# Patient Record
Sex: Male | Born: 1965
Health system: Southern US, Community
[De-identification: ages and names within clinical notes are randomized; demographics above are authoritative.]

## PROBLEM LIST (undated history)

## (undated) DIAGNOSIS — F988 Other specified behavioral and emotional disorders with onset usually occurring in childhood and adolescence: Secondary | ICD-10-CM

## (undated) DIAGNOSIS — E782 Mixed hyperlipidemia: Secondary | ICD-10-CM

## (undated) HISTORY — DX: Other specified behavioral and emotional disorders with onset usually occurring in childhood and adolescence: F98.8

## (undated) HISTORY — DX: Male erectile dysfunction, unspecified: N52.9

## (undated) HISTORY — DX: Mixed hyperlipidemia: E78.2

---

## 2018-03-29 ENCOUNTER — Ambulatory Visit: Payer: Self-pay | Admitting: Family Medicine

## 2018-03-29 ENCOUNTER — Ambulatory Visit (INDEPENDENT_AMBULATORY_CARE_PROVIDER_SITE_OTHER): Payer: No Typology Code available for payment source | Admitting: Family Medicine

## 2018-03-29 ENCOUNTER — Encounter: Payer: Self-pay | Admitting: Family Medicine

## 2018-03-29 VITALS — BP 138/89 | HR 73 | Resp 17 | Ht 71.0 in | Wt 238.4 lb

## 2018-03-29 DIAGNOSIS — F900 Attention-deficit hyperactivity disorder, predominantly inattentive type: Secondary | ICD-10-CM

## 2018-03-29 DIAGNOSIS — Z1329 Encounter for screening for other suspected endocrine disorder: Secondary | ICD-10-CM

## 2018-03-29 DIAGNOSIS — Z131 Encounter for screening for diabetes mellitus: Secondary | ICD-10-CM

## 2018-03-29 DIAGNOSIS — Z7689 Persons encountering health services in other specified circumstances: Secondary | ICD-10-CM

## 2018-03-29 DIAGNOSIS — R9431 Abnormal electrocardiogram [ECG] [EKG]: Secondary | ICD-10-CM | POA: Diagnosis not present

## 2018-03-29 DIAGNOSIS — Z9862 Peripheral vascular angioplasty status: Secondary | ICD-10-CM

## 2018-03-29 DIAGNOSIS — Z0000 Encounter for general adult medical examination without abnormal findings: Secondary | ICD-10-CM

## 2018-03-29 NOTE — Patient Instructions (Addendum)
Thank you for choosing Primary Care at [facility] to be your medical home!    Randall Collins was seen by Molli Barrows, FNP today.   Randall Collins's primary care provider is Scot Jun, FNP.   For the best care possible, you should try to see Molli Barrows, FNP-C whenever you come to the clinic.   We look forward to seeing you again soon!  If you have any questions about your visit today, please call us at [phone] or feel free to reach your primary care provider via [place].       I have referred you to [place] [phone].  You have been referred to Cardiology [place] [phone]   Preventive Care 40-64 Years, Male Preventive care refers to lifestyle choices and visits with your health care provider that can promote health and wellness. What does preventive care include?  A yearly physical exam. This is also called an annual well check.  Dental exams once or twice a year.  Routine eye exams. Ask your health care provider how often you should have your eyes checked.  Personal lifestyle choices, including: ? Daily care of your teeth and gums. ? Regular physical activity. ? Eating a healthy diet. ? Avoiding tobacco and drug use. ? Limiting alcohol use. ? Practicing safe sex. ? Taking low-dose aspirin every day starting at age 64. What happens during an annual well check? The services and screenings done by your health care provider during your annual well check will depend on your age, overall health, lifestyle risk factors, and family history of disease. Counseling Your health care provider may ask you questions about your:  Alcohol use.  Tobacco use.  Drug use.  Emotional well-being.  Home and relationship well-being.  Sexual activity.  Eating habits.  Work and work Statistician.  Screening You may have the following tests or measurements:  Height, weight, and BMI.  Blood  pressure.  Lipid and cholesterol levels. These may be checked every 5 years, or more frequently if you are over 42 years old.  Skin check.  Lung cancer screening. You may have this screening every year starting at age 59 if you have a 30-pack-year history of smoking and currently smoke or have quit within the past 15 years.  Fecal occult blood test (FOBT) of the stool. You may have this test every year starting at age 76.  Flexible sigmoidoscopy or colonoscopy. You may have a sigmoidoscopy every 5 years or a colonoscopy every 10 years starting at age 63.  Prostate cancer screening. Recommendations will vary depending on your family history and other risks.  Hepatitis C blood test.  Hepatitis B blood test.  Sexually transmitted disease (STD) testing.  Diabetes screening. This is done by checking your blood sugar (glucose) after you have not eaten for a while (fasting). You may have this done every 1-3 years.  Discuss your test results, treatment options, and if necessary, the need for more tests with your health care provider. Vaccines Your health care provider may recommend certain vaccines, such as:  Influenza vaccine. This is recommended every year.  Tetanus, diphtheria, and acellular pertussis (Tdap, Td) vaccine. You may need a Td booster every 10 years.  Varicella vaccine. You may need this if you have not been vaccinated.  Zoster vaccine. You may need this after age 66.  Measles, mumps, and rubella (MMR) vaccine. You may need at least one dose of MMR if you were born in 1957 or later. You  may also need a second dose.  Pneumococcal 13-valent conjugate (PCV13) vaccine. You may need this if you have certain conditions and have not been vaccinated.  Pneumococcal polysaccharide (PPSV23) vaccine. You may need one or two doses if you smoke cigarettes or if you have certain conditions.  Meningococcal vaccine. You may need this if you have certain conditions.  Hepatitis A  vaccine. You may need this if you have certain conditions or if you travel or work in places where you may be exposed to hepatitis A.  Hepatitis B vaccine. You may need this if you have certain conditions or if you travel or work in places where you may be exposed to hepatitis B.  Haemophilus influenzae type b (Hib) vaccine. You may need this if you have certain risk factors.  Talk to your health care provider about which screenings and vaccines you need and how often you need them. This information is not intended to replace advice given to you by your health care provider. Make sure you discuss any questions you have with your health care provider. Document Released: 05/08/2015 Document Revised: 12/30/2015 Document Reviewed: 02/10/2015 Elsevier Interactive Patient Education  [employer].

## 2018-03-29 NOTE — Progress Notes (Signed)
Randall Collins, is a 52 y.o. male  WUJ:[id number]  [id number]  DOB - 04-10-1966  CC:  Chief Complaint  Patient presents with  . Establish Care  . Annual Exam    not fasting       HPI: Randall Collins is a 52 y.o. male is here today to establish care.   [employer] does not have a problem list on file.   Today's visit:  Patient is here to establish care and for complete CPE.  Patient recently relocated from [place] and is currently an employee at [employer].  Reports that he is generally healthy.  Report at some point he was recommended to start statin therapy due to elevations in LDL.  He opted at that time not to start medication.  He however has a significant cardiovascular family history with his father dying of an MI and diabetes.  His paternal grandfather also died of a heart attack.  His brother has had a heart attack at age 30.  He also has undergone a heart cath back in 2016 for a complaint of chest pain and was followed by cardiology in [place].  He reports that his heart cath was negative and recommendations were made to follow-up periodically with cardiology for monitoring.  He denies any chest pain however notes some shortness of breath with exertion such as going up stairs.  He has not had a recent stress test.  He is mostly an inactive a physical activity.  He is a never smoker.  Occasionally drinks alcohol.  Notes that he does not follow any restricted diet.  Denies edema, new weakness, or chronic headache.  He is requesting referral to cardiology.  He reports a history of ADHD.  Reports he was previously taking Adderall for management of ADHD.  He is currently not under psychiatry care.  He is currently not on any medication.  He is interested in resuming medication for ADHD as he feels that he is having difficulty focusing.  Pertinent family medical history: family history includes Diabetes in his father; Heart attack in his father and paternal grandfather; Hypertension  in his father.   Allergies  Allergen Reactions  . Keflex [Cephalexin] Rash    Social History   Socioeconomic History  . Marital status: Married    Spouse name: Not on file  . Number of children: Not on file  . Years of education: Not on file  . Highest education level: Not on file  Occupational History  . Not on file  Social Needs  . Financial resource strain: Not on file  . Food insecurity:    Worry: Not on file    Inability: Not on file  . Transportation needs:    Medical: Not on file    Non-medical: Not on file  Tobacco Use  . Smoking status: Never Smoker  . Smokeless tobacco: Never Used  Substance and Sexual Activity  . Alcohol use: Yes    Comment: socially  . Drug use: Not on file  . Sexual activity: Yes    Partners: Female  Lifestyle  . Physical activity:    Days per week: Not on file    Minutes per session: Not on file  . Stress: Not on file  Relationships  . Social connections:    Talks on phone: Not on file    Gets together: Not on file    Attends religious service: Not on file    Active member of club or organization: Not on file  Attends meetings of clubs or organizations: Not on file    Relationship status: Not on file  . Intimate partner violence:    Fear of current or ex partner: Not on file    Emotionally abused: Not on file    Physically abused: Not on file    Forced sexual activity: Not on file  Other Topics Concern  . Not on file  Social History Narrative  . Not on file    Review of Systems: Constitutional: Negative for fever, chills, diaphoresis, activity change, appetite change and fatigue. HENT: Negative for ear pain, nosebleeds, congestion, facial swelling, rhinorrhea, neck pain, neck stiffness and ear discharge.  Eyes: Negative for pain, discharge, redness, itching and visual disturbance. Respiratory: Negative for cough, choking, chest tightness, wheezing and stridor.  Shortness of breath with exertion. Cardiovascular: Negative  for chest pain, palpitations and leg swelling. Gastrointestinal: Negative for abdominal distention. Genitourinary: Negative for dysuria, urgency, frequency, hematuria, flank pain, decreased urine volume, difficulty urinating. Musculoskeletal: Negative for back pain, joint swelling, arthralgia and gait problem. Neurological: Negative for dizziness, tremors, seizures, syncope, facial asymmetry, speech difficulty, weakness, light-headedness, numbness and headaches.  Hematological: Negative for adenopathy. Does not bruise/bleed easily. Psychiatric/Behavioral: Negative for hallucinations, behavioral problems, confusion, dysphoric mood, decreased concentration and agitation.  Objective:   Vitals:   03/29/18 1118  BP: 138/89  Pulse: 73  Resp: 17  SpO2: 96%    BP Readings from Last 3 Encounters:  03/29/18 138/89   Filed Weights   03/29/18 1118  Weight: 238 lb 6.4 oz (108.1 kg)      Physical Exam: Constitutional: Patient appears well-developed and well-nourished. No distress. HENT: Normocephalic, atraumatic, External right and left ear normal.  Eyes: Conjunctivae and EOM are normal. PERRLA, no scleral icterus. Neck: Normal ROM. Neck supple. No JVD. No tracheal deviation. No thyromegaly. CVS: RRR, S1/S2 +, no murmurs, no gallops, no carotid bruit.  Pulmonary: Effort and breath sounds normal, no stridor, rhonchi, wheezes, rales.  Abdominal: Soft. BS +, no distension, tenderness, rebound or guarding.  Musculoskeletal: Normal range of motion. No edema and no tenderness.  Neuro: Alert. Normal muscle tone coordination. Normal gait. BUE and BLE strength 5/5. Bilateral hand grips symmetrical. Skin: Skin is warm and dry. No rash noted. Not diaphoretic. No erythema. No pallor. Psychiatric: Normal mood and affect. Behavior, judgment, thought content normal.     Assessment and plan:  1. Encounter to establish care 2. Annual physical exam Age-appropriate anticipatory guidance provided. - EKG  12-Lead - CBC with Differential  3. Screening for diabetes mellitus - Comprehensive metabolic panel - Hemoglobin A1c  4. Screening for thyroid disorder - Thyroid Panel With TSH  5. ECG abnormality EKG today is significant for right bundle branch block and nonspecific T wave-abnormality - Ambulatory referral to Cardiology  6. Hx of angioplasty - Ambulatory referral to Cardiology  7. ADHD (attention deficit hyperactivity disorder), inattentive type Referral placed to the [place] [facility] for further evaluation and work-up and management of ADHD disorder.   Orders Placed This Encounter  Procedures  . CBC with Differential  . Comprehensive metabolic panel  . Thyroid Panel With TSH  . Hemoglobin A1c  . Ambulatory referral to Cardiology    Referral Priority:   Routine    Referral Type:   Consultation    Referral Reason:   Specialty Services Required    Requested Specialty:   Cardiology    Number of Visits Requested:   1  . EKG 12-Lead    Schedule a follow-up  lab appointment to have fasting lipid panel drawn. Follow-up in 1 year for complete physical exam.  Follow-up sooner as needed or if any acute conditions occur.  The patient was given clear instructions to go to ER or return to medical center if symptoms don't improve, worsen or new problems develop. The patient verbalized understanding. The patient was advised  to call and obtain lab results if they haven't heard anything from out office within 7-10 business days.  Joaquin Courts, FNP Primary Care at [facility] [address] [place] [address] [phone]: [phone]    This note has been created with Dragon speech recognition software and Paediatric nurse. Any transcriptional errors are unintentional.

## 2018-03-30 LAB — CBC WITH DIFFERENTIAL/PLATELET
Basophils Absolute: 0 10*3/uL (ref 0.0–0.2)
Basos: 1 %
EOS (ABSOLUTE): 0.1 10*3/uL (ref 0.0–0.4)
EOS: 2 %
Hematocrit: 47.9 % (ref 37.5–51.0)
Hemoglobin: 16 g/dL (ref 13.0–17.7)
IMMATURE GRANS (ABS): 0 10*3/uL (ref 0.0–0.1)
Immature Granulocytes: 0 %
Lymphocytes Absolute: 2 10*3/uL (ref 0.7–3.1)
Lymphs: 34 %
MCH: 29.5 pg (ref 26.6–33.0)
MCHC: 33.4 g/dL (ref 31.5–35.7)
MCV: 88 fL (ref 79–97)
Monocytes Absolute: 0.4 10*3/uL (ref 0.1–0.9)
Monocytes: 7 %
Neutrophils Absolute: 3.3 10*3/uL (ref 1.4–7.0)
Neutrophils: 56 %
Platelets: 281 10*3/uL (ref 150–450)
RBC: 5.42 x10E6/uL (ref 4.14–5.80)
RDW: 13.2 % (ref 12.3–15.4)
WBC: 5.8 10*3/uL (ref 3.4–10.8)

## 2018-03-30 LAB — THYROID PANEL WITH TSH
Free Thyroxine Index: 2.3 (ref 1.2–4.9)
T3 Uptake Ratio: 22 % — ABNORMAL LOW (ref 24–39)
T4, Total: 10.3 ug/dL (ref 4.5–12.0)
TSH: 1.6 u[IU]/mL (ref 0.450–4.500)

## 2018-03-30 LAB — HEMOGLOBIN A1C
Est. average glucose Bld gHb Est-mCnc: 103 mg/dL
Hgb A1c MFr Bld: 5.2 % (ref 4.8–5.6)

## 2018-03-30 LAB — COMPREHENSIVE METABOLIC PANEL
ALBUMIN: 4.7 g/dL (ref 3.5–5.5)
ALT: 31 IU/L (ref 0–44)
AST: 24 IU/L (ref 0–40)
Albumin/Globulin Ratio: 2.2 (ref 1.2–2.2)
Alkaline Phosphatase: 95 IU/L (ref 39–117)
BUN / CREAT RATIO: 15 (ref 9–20)
BUN: 14 mg/dL (ref 6–24)
Bilirubin Total: 0.5 mg/dL (ref 0.0–1.2)
CO2: 25 mmol/L (ref 20–29)
Calcium: 9.6 mg/dL (ref 8.7–10.2)
Chloride: 103 mmol/L (ref 96–106)
Creatinine, Ser: 0.94 mg/dL (ref 0.76–1.27)
GFR calc Af Amer: 107 mL/min/{1.73_m2} (ref >59)
GFR calc non Af Amer: 93 mL/min/{1.73_m2} (ref >59)
GLOBULIN, TOTAL: 2.1 g/dL (ref 1.5–4.5)
Glucose: 82 mg/dL (ref 65–99)
Potassium: 4.5 mmol/L (ref 3.5–5.2)
SODIUM: 145 mmol/L — AB (ref 134–144)
TOTAL PROTEIN: 6.8 g/dL (ref 6.0–8.5)

## 2018-04-02 ENCOUNTER — Other Ambulatory Visit: Payer: No Typology Code available for payment source

## 2018-04-02 DIAGNOSIS — R7989 Other specified abnormal findings of blood chemistry: Secondary | ICD-10-CM

## 2018-04-02 DIAGNOSIS — Z13220 Encounter for screening for lipoid disorders: Secondary | ICD-10-CM

## 2018-04-03 LAB — LIPID PANEL
Chol/HDL Ratio: 5.6 ratio — ABNORMAL HIGH (ref 0.0–5.0)
Cholesterol, Total: 207 mg/dL — ABNORMAL HIGH (ref 100–199)
HDL: 37 mg/dL — ABNORMAL LOW (ref >39)
LDL Calculated: 131 mg/dL — ABNORMAL HIGH (ref 0–99)
Triglycerides: 196 mg/dL — ABNORMAL HIGH (ref 0–149)
VLDL Cholesterol Cal: 39 mg/dL (ref 5–40)

## 2018-04-03 LAB — TESTOSTERONE: Testosterone: 331 ng/dL (ref 264–916)

## 2018-04-03 NOTE — Progress Notes (Signed)
Patient notified of results during lab appointment. Expressed understanding.

## 2018-04-05 ENCOUNTER — Telehealth: Payer: Self-pay | Admitting: Family Medicine

## 2018-04-05 MED ORDER — ATORVASTATIN CALCIUM 20 MG PO TABS: 20.0000 mg | ORAL_TABLET | Freq: Every day | ORAL | 3 refills | Status: DC

## 2018-04-05 NOTE — Telephone Encounter (Signed)
Contact patient to advise his cholesterol panel is grossly abnormal.  I do recommend statin therapy with atorvastatin 20 mg once daily as his cholesterol level is at very increased risk for cardiovascular event.  I encouraged incorporating increased vegetables and intake of lean and small portions of meat and incorporate in routine physical activity as tolerated and to lifestyle.  His testosterone level remains normal no need to resume testosterone replacement.  I have sent medication to [facility]

## 2018-04-06 NOTE — Telephone Encounter (Signed)
Patient notified of lab results & recommendations. Expressed understanding. Agreeable to starting medication.

## 2018-04-12 MED FILL — ATORVASTATIN CALCIUM 20 MG: 20 | 90 days supply | Qty: 90 | Fill #0

## 2018-04-17 ENCOUNTER — Ambulatory Visit: Payer: Self-pay | Admitting: Family Medicine

## 2018-05-23 ENCOUNTER — Ambulatory Visit: Payer: No Typology Code available for payment source | Admitting: Cardiovascular Disease

## 2018-05-23 ENCOUNTER — Encounter: Payer: Self-pay | Admitting: Cardiovascular Disease

## 2018-05-23 DIAGNOSIS — E782 Mixed hyperlipidemia: Secondary | ICD-10-CM

## 2018-05-23 DIAGNOSIS — Z8249 Family history of ischemic heart disease and other diseases of the circulatory system: Secondary | ICD-10-CM | POA: Diagnosis not present

## 2018-05-23 DIAGNOSIS — E785 Hyperlipidemia, unspecified: Secondary | ICD-10-CM | POA: Insufficient documentation

## 2018-05-23 MED ORDER — ATORVASTATIN CALCIUM 40 MG PO TABS: 40.0000 mg | ORAL_TABLET | Freq: Every day | ORAL | 3 refills | Status: DC

## 2018-05-23 NOTE — Assessment & Plan Note (Signed)
History of hyperlipidemia on atorvastatin 20 mg a day with lipid profile performed 04/02/2018 revealing total cholesterol 207, LDL 131 and HDL of 37

## 2018-05-23 NOTE — Progress Notes (Signed)
05/23/2018 Randall Collins   03/25/1966  [id number]  Primary Physician Bing NeighborsHarris, Kimberly S, FNP Primary Cardiologist: Runell GessJonathan J Berry MD Nicholes CalamityFACP, FACC, FAHA, [place]  HPI:  Randall Collins is a 53 y.o. mildly overweight married Caucasian male father of 2 children [place].  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  He was referred by Joaquin CourtsKimberly Harris, FNP for cardiovascular valuation because of an abnormal EKG and risk factors including family history.  He does not smoke.  He has treated hyperlipidemia which is not at goal.  He is never had a heart attack or stroke.  His father did have a myocardial infarction age 53 and his brother at age 53.  He apparently had a coronary calcium score done several years ago which was fairly low.  He is completely asymptomatic.   Current Meds  Medication Sig  . atorvastatin (LIPITOR) 20 MG tablet Take 1 tablet (20 mg total) by mouth daily.     Allergies  Allergen Reactions  . Keflex [Cephalexin] Rash    Social History   Socioeconomic History  . Marital status: Married    Spouse name: Not on file  . Number of children: Not on file  . Years of education: Not on file  . Highest education level: Not on file  Occupational History  . Not on file  Social Needs  . Financial resource strain: Not on file  . Food insecurity:    Worry: Not on file    Inability: Not on file  . Transportation needs:    Medical: Not on file    Non-medical: Not on file  Tobacco Use  . Smoking status: Never Smoker  . Smokeless tobacco: Never Used  Substance and Sexual Activity  . Alcohol use: Yes    Comment: socially  . Drug use: Not on file  . Sexual activity: Yes    Partners: Female  Lifestyle  . Physical activity:    Days per week: Not on file    Minutes per session: Not on file  . Stress: Not on file  Relationships  . Social connections:    Talks on phone: Not on file    Gets together: Not on file   Attends religious service: Not on file    Active member of club or organization: Not on file    Attends meetings of clubs or organizations: Not on file    Relationship status: Not on file  . Intimate partner violence:    Fear of current or ex partner: Not on file    Emotionally abused: Not on file    Physically abused: Not on file    Forced sexual activity: Not on file  Other Topics Concern  . Not on file  Social History Narrative  . Not on file     Review of Systems: General: negative for chills, fever, night sweats or weight changes.  Cardiovascular: negative for chest pain, dyspnea on exertion, edema, orthopnea, palpitations, paroxysmal nocturnal dyspnea or shortness of breath Dermatological: negative for rash Respiratory: negative for cough or wheezing Urologic: negative for hematuria Abdominal: negative for nausea, vomiting, diarrhea, bright red blood per rectum, melena, or hematemesis Neurologic: negative for visual changes, syncope, or dizziness All other systems reviewed and are otherwise negative except as noted above.    Blood pressure 140/88, pulse 62, height 5\' 11"  (1.803 m), weight 247 lb (112 kg).  General appearance: alert and no distress Neck: no adenopathy, no  carotid bruit, no JVD, supple, symmetrical, trachea midline and thyroid not enlarged, symmetric, no tenderness/mass/nodules Lungs: clear to auscultation bilaterally Heart: regular rate and rhythm, S1, S2 normal, no murmur, click, rub or gallop Extremities: extremities normal, atraumatic, no cyanosis or edema Pulses: 2+ and symmetric Skin: Skin color, texture, turgor normal. No rashes or lesions Neurologic: Alert and oriented X 3, normal strength and tone. Normal symmetric reflexes. Normal coordination and gait  EKG sinus rhythm at 62 with low limb voltage, nonspecific ST and T wave changes and incomplete right bundle branch block.  I personally reviewed this EKG.  ASSESSMENT AND PLAN:    Hyperlipidemia History of hyperlipidemia on atorvastatin 20 mg a day with lipid profile performed 04/02/2018 revealing total cholesterol 207, LDL 131 and HDL of 37  Family history of heart disease Family history of heart disease with a father who is had a myocardial infarction at age 755 and a brother at age 548.  He has had a coronary calcium score several years ago which was in the 5 percentile.  We will repeat a coronary calcium score.      Runell GessJonathan J. Berry MD FACP,FACC,FAHA, [facility] 05/23/2018 12:31 PM

## 2018-05-23 NOTE — Assessment & Plan Note (Signed)
Family history of heart disease with a father who is had a myocardial infarction at age 53 and a brother at age 29.  He has had a coronary calcium score several years ago which was in the 5 percentile.  We will repeat a coronary calcium score.

## 2018-05-23 NOTE — Addendum Note (Signed)
Addended by: Chana Bode on: 05/23/2018 01:53 PM   Modules accepted: Orders

## 2018-05-23 NOTE — Patient Instructions (Signed)
Medication Instructions:  INCREASE YOUR ATORVASTATIN TO 40 MG, ONE TABLET BY MOUTH DAILY If you need a refill on your cardiac medications before your next appointment, please call your pharmacy.   Lab work: Your physician recommends that you return for lab work in 3 MONTHS; LIPID/LIVER PANEL  If you have labs (blood work) drawn today and your tests are completely normal, you will receive your results only by: Marland Kitchen MyChart Message (if you have MyChart) OR . A paper copy in the mail If you have any lab test that is abnormal or we need to change your treatment, we will call you to review the results.  Testing/Procedures: YOUR PHYSICIAN HAS RECOMMENDED THAT YOU HAVE A CORONARY CALCIUM SCAN Coronary Calcium Scan A coronary calcium scan is an imaging test used to look for deposits of calcium and other fatty materials (plaques) in the inner lining of the blood vessels of the heart (coronary arteries). These deposits of calcium and plaques can partly clog and narrow the coronary arteries without producing any symptoms or warning signs. This puts a person at risk for a heart attack. This test can detect these deposits before symptoms develop. Tell a health care provider about:  Any allergies you have.  All medicines you are taking, including vitamins, herbs, eye drops, creams, and over-the-counter medicines.  Any problems you or family members have had with anesthetic medicines.  Any blood disorders you have.  Any surgeries you have had.  Any medical conditions you have.  Whether you are pregnant or may be pregnant. What are the risks? Generally, this is a safe procedure. However, problems may occur, including:  Harm to a pregnant woman and her unborn baby. This test involves the use of radiation. Radiation exposure can be dangerous to a pregnant woman and her unborn baby. If you are pregnant, you generally should not have this procedure done.  Slight increase in the risk of cancer. This is  because of the radiation involved in the test. What happens before the procedure? No preparation is needed for this procedure. What happens during the procedure?   You will undress and remove any jewelry around your neck or chest.  You will put on a hospital gown.  Sticky electrodes will be placed on your chest. The electrodes will be connected to an electrocardiogram (ECG) machine to record a tracing of the electrical activity of your heart.  A CT scanner will take pictures of your heart. During this time, you will be asked to lie still and hold your breath for 2-3 seconds while a picture of your heart is being taken. The procedure may vary among health care providers and hospitals. What happens after the procedure?  You can get dressed.  You can return to your normal activities.  It is up to you to get the results of your test. Ask your health care provider, or the department that is doing the test, when your results will be ready. Summary  A coronary calcium scan is an imaging test used to look for deposits of calcium and other fatty materials (plaques) in the inner lining of the blood vessels of the heart (coronary arteries).  Generally, this is a safe procedure. Tell your health care provider if you are pregnant or may be pregnant.  No preparation is needed for this procedure.  A CT scanner will take pictures of your heart.  You can return to your normal activities after the scan is done. This information is not intended to replace advice given  to you by your health care provider. Make sure you discuss any questions you have with your health care provider. Document Released: 10/08/2007 Document Revised: 02/29/2016 Document Reviewed: 02/29/2016 Elsevier Interactive Patient Education  2019 [employer].   Follow-Up: At [facility], you and your health needs are our priority.  As part of our continuing mission to provide you with exceptional heart care, we have created  designated Provider Care Teams.  These Care Teams include your primary Cardiologist (physician) and Advanced Practice Providers (APPs -  Physician Assistants and Nurse Practitioners) who all work together to provide you with the care you need, when you need it. . You will need a follow up appointment in 12 months.  Please call our office 2 months in advance to schedule this appointment.  You may see Dr. Allyson Sabal or one of the following Advanced Practice Providers on your designated Care Team:   . Corine Shelter, [place] . Azalee Course, PA-C . Micah Flesher, PA-C . Joni Reining, DNP . Theodore Demark, PA-C . Judy Pimple, PA-C . Marjie Skiff, PA-C

## 2018-05-25 ENCOUNTER — Other Ambulatory Visit: Payer: No Typology Code available for payment source

## 2018-05-28 MED FILL — ATORVASTATIN 40 MG TABLET: 40 | 90 days supply | Qty: 90 | Fill #0

## 2018-05-30 MED FILL — VYVANSE 20 MG CAPSULE: 20 | 30 days supply | Qty: 30 | Fill #0

## 2018-06-14 ENCOUNTER — Ambulatory Visit (INDEPENDENT_AMBULATORY_CARE_PROVIDER_SITE_OTHER)
Admission: RE | Admit: 2018-06-14 | Discharge: 2018-06-14 | Disposition: A | Discharge status: Home / Self Care | Payer: Self-pay | Source: Ambulatory Visit | Attending: Cardiovascular Disease | Admitting: Cardiovascular Disease

## 2018-06-14 DIAGNOSIS — Z8249 Family history of ischemic heart disease and other diseases of the circulatory system: Secondary | ICD-10-CM

## 2018-06-27 ENCOUNTER — Encounter: Payer: Self-pay | Admitting: Family Medicine

## 2018-06-28 ENCOUNTER — Telehealth: Payer: Self-pay | Admitting: Cardiovascular Disease

## 2018-06-28 DIAGNOSIS — E782 Mixed hyperlipidemia: Secondary | ICD-10-CM

## 2018-06-28 MED ORDER — ATORVASTATIN CALCIUM 80 MG PO TABS: 80.0000 mg | ORAL_TABLET | Freq: Every day | ORAL | 1 refills | Status: DC

## 2018-06-28 NOTE — Telephone Encounter (Signed)
Called patient back regarding CT results. Advised of results, and medication that was submitted. Patient verbalized understanding.  Lab orders ordered for 3 months.

## 2018-06-28 NOTE — Telephone Encounter (Signed)
New Message    Patient returning phone call about CT results.

## 2018-06-29 MED ORDER — ZOSTER VAC RECOMB ADJUVANTED 50 MCG/0.5ML IM SUSR: 0.5000 mL | Freq: Once | INTRAMUSCULAR | 0 refills | Status: AC

## 2018-06-29 MED FILL — SHINGRIX 50 MCG SUS: 50 | 1 days supply | Qty: 1 | Fill #0

## 2018-07-05 MED FILL — ATORVASTATIN 80 MG TABLET: 80 | 90 days supply | Qty: 90 | Fill #0

## 2018-07-17 ENCOUNTER — Encounter: Payer: Self-pay | Admitting: Family Medicine

## 2018-07-17 MED ORDER — MUPIROCIN 2 % EX OINT: 1.0000 "application " | TOPICAL_OINTMENT | Freq: Three times a day (TID) | CUTANEOUS | 0 refills | Status: DC

## 2018-07-18 MED FILL — MUPIROCIN 2% OINTMENT: 2 | 15 days supply | Qty: 66 | Fill #0

## 2018-07-26 MED FILL — VYVANSE 40 MG CAPSULE: 40 | 30 days supply | Qty: 30 | Fill #0

## 2018-10-02 MED FILL — VYVANSE 40 MG CAPSULE: 40 | 30 days supply | Qty: 30 | Fill #0

## 2018-11-06 MED FILL — VYVANSE 40 MG CAPSULE: 40 | 30 days supply | Qty: 30 | Fill #0

## 2018-11-19 MED FILL — VYVANSE 40 MG CAPSULE: 40 | 30 days supply | Qty: 30 | Fill #0

## 2018-12-14 ENCOUNTER — Encounter: Payer: Self-pay | Admitting: Family Medicine

## 2018-12-18 ENCOUNTER — Telehealth: Payer: Self-pay

## 2018-12-18 NOTE — Telephone Encounter (Signed)

## 2018-12-19 ENCOUNTER — Ambulatory Visit (INDEPENDENT_AMBULATORY_CARE_PROVIDER_SITE_OTHER): Payer: No Typology Code available for payment source | Admitting: Nurse Practitioner

## 2018-12-19 ENCOUNTER — Encounter: Payer: Self-pay | Admitting: Nurse Practitioner

## 2018-12-19 ENCOUNTER — Other Ambulatory Visit: Payer: Self-pay

## 2018-12-19 VITALS — BP 114/75 | HR 75 | Temp 97.5°F | Resp 17 | Ht 71.0 in | Wt 241.4 lb

## 2018-12-19 DIAGNOSIS — Z1211 Encounter for screening for malignant neoplasm of colon: Secondary | ICD-10-CM | POA: Diagnosis not present

## 2018-12-19 DIAGNOSIS — H6592 Unspecified nonsuppurative otitis media, left ear: Secondary | ICD-10-CM

## 2018-12-19 DIAGNOSIS — E782 Mixed hyperlipidemia: Secondary | ICD-10-CM

## 2018-12-19 DIAGNOSIS — H7292 Unspecified perforation of tympanic membrane, left ear: Secondary | ICD-10-CM | POA: Diagnosis not present

## 2018-12-19 MED ORDER — NEOMYCIN-POLYMYXIN-HC 3.5-10000-1 OT SUSP: 3.0000 [drp] | Freq: Three times a day (TID) | OTIC | 0 refills | Status: AC
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
## 2018-12-20 ENCOUNTER — Other Ambulatory Visit: Payer: No Typology Code available for payment source

## 2018-12-25 ENCOUNTER — Other Ambulatory Visit: Payer: No Typology Code available for payment source

## 2018-12-27 MED FILL — VYVANSE 40 MG CAPSULE: 40 | 30 days supply | Qty: 30 | Fill #0

## 2019-01-29 MED FILL — VYVANSE 40 MG CAPSULE: 40 | 30 days supply | Qty: 30 | Fill #0

## 2019-02-15 ENCOUNTER — Other Ambulatory Visit: Payer: Self-pay | Admitting: Nurse Practitioner

## 2019-02-21 ENCOUNTER — Other Ambulatory Visit: Payer: No Typology Code available for payment source

## 2019-02-22 LAB — FECAL OCCULT BLOOD, IMMUNOCHEMICAL: Fecal Occult Bld: NEGATIVE

## 2019-03-19 ENCOUNTER — Telehealth (INDEPENDENT_AMBULATORY_CARE_PROVIDER_SITE_OTHER): Payer: No Typology Code available for payment source | Admitting: Internal Medicine

## 2019-03-19 DIAGNOSIS — M545 Low back pain, unspecified: Secondary | ICD-10-CM

## 2019-03-19 NOTE — Progress Notes (Signed)
Has c/o L sided lower back pain x 3 weeks. Describes it as sharp stabbing pain that worsens with movement. At it's worse it was 7/10, currently 3/10.  No specific injury but he does work at the hospital. States that it does not radiate down his legs. No incontinence issues. Has been taking Ibuprofen, OTC medication patches & ice with some relief.

## 2019-03-19 NOTE — Progress Notes (Signed)
Virtual Visit via Video Note Due to current restrictions/limitations of in-office visits due to the COVID-19 pandemic, this scheduled clinical appointment was converted to a telehealth visit This was initially a video visit but 5 minutes into it we had to converted to a telephone visit as the video went down. I connected with Randall Collins on 03/19/19 at  1:50 PM EST by a video enabled telemedicine application and verified that I am speaking with the correct person using two identifiers. I am in my office.  The patient is at home.  Only the patient and myself participated in this encounter.  I discussed the limitations of evaluation and management by telemedicine and the availability of in person appointments. The patient expressed understanding and agreed to proceed.  History of Present Illness: This was an urgent care visit for left lower back pain. Patient complains of left lower back pain that started 3 weeks ago.  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  He thinks he may have twisted the wrong way leading to the current issue.  When pain was at its worst it was a 7 out of 10.  There was no radiation down the legs.  No numbness or tingling in the legs.  No loss of bowel or bladder function.  He was taking ibuprofen 600 mg about 4 times a week.  On days when he took it he took it only once a day.  He was also icing the lower back, using icy hot patch and taking walks.  Pain has been better over the past several days.  Now rates it as 3 out of 10.  He would like to be referred to a chiropractor.  His brother who is a PA in [place] had recommended this to him.  Outpatient Encounter Medications as of 03/19/2019  Medication Sig  . atorvastatin (LIPITOR) 80 MG tablet Take 1 tablet (80 mg total) by mouth daily.  Marland Kitchen VYVANSE 40 MG capsule Take 1 capsule by mouth every morning.   No facility-administered  encounter medications on file as of 03/19/2019.       Observations/Objective: No physical exam done  Assessment and Plan: 1. Acute left-sided low back pain without sciatica Sounds like a muscle strain. Recommend using a heating pad and continue ibuprofen as needed.  Offered muscle relaxant but patient declined.  He would like to see a chiropractor.  Referral l submitted. - Ambulatory referral to Chiropractic   Follow Up Instructions: As needed   I discussed the assessment and treatment plan with the patient. The patient was provided an opportunity to ask questions and all were answered. The patient agreed with the plan and demonstrated an understanding of the instructions.   The patient was advised to call back or seek an in-person evaluation if the symptoms worsen or if the condition fails to improve as anticipated.  I provided 9 minutes of non-face-to-face time during this encounter.   Karle Plumber, MD

## 2019-04-01 MED FILL — VYVANSE 40 MG CAPSULE: 40 | 30 days supply | Qty: 30 | Fill #0

## 2019-04-16 ENCOUNTER — Ambulatory Visit (INDEPENDENT_AMBULATORY_CARE_PROVIDER_SITE_OTHER): Payer: No Typology Code available for payment source | Admitting: Physician Assistant

## 2019-04-16 DIAGNOSIS — H3321 Serous retinal detachment, right eye: Secondary | ICD-10-CM | POA: Diagnosis not present

## 2019-04-16 NOTE — Progress Notes (Signed)
Patient ID: Randall Collins, male   DOB: 1965/05/04, 53 y.o.   MRN: [id number] Virtual Visit via Telephone Note  I connected with Marcia Brash on 04/16/19 at 10:30 AM EST by telephone and verified that I am speaking with the correct person using two identifiers.   I discussed the limitations, risks, security and privacy concerns of performing an evaluation and management service by telephone and the availability of in person appointments. I also discussed with the patient that there may be a patient responsible charge related to this service. The patient expressed understanding and agreed to proceed.  Patient location: My Location:  [place] office Persons on the call:   History of Present Illness: 2 week h/o vertical streaking light/line behind R eye.  Has appt with Dr Herbert Deaner tomorrow.     Observations/Objective:  NAD.  A&Ox3   Assessment and Plan: 1. Right retinal detachment To ED if anything changes prior to appt. - Ambulatory referral to Ophthalmology    Follow Up Instructions: See PCP prn   I discussed the assessment and treatment plan with the patient. The patient was provided an opportunity to ask questions and all were answered. The patient agreed with the plan and demonstrated an understanding of the instructions.   The patient was advised to call back or seek an in-person evaluation if the symptoms worsen or if the condition fails to improve as anticipated.  I provided 7 minutes of non-face-to-face time during this encounter.   Freeman Caldron, PA-C

## 2019-04-24 ENCOUNTER — Ambulatory Visit
Admission: EM | Admit: 2019-04-24 | Discharge: 2019-04-24 | Disposition: A | Discharge status: Home / Self Care | Payer: No Typology Code available for payment source | Attending: Physician Assistant | Admitting: Physician Assistant

## 2019-04-24 ENCOUNTER — Other Ambulatory Visit: Payer: No Typology Code available for payment source

## 2019-04-24 DIAGNOSIS — R52 Pain, unspecified: Secondary | ICD-10-CM | POA: Diagnosis not present

## 2019-04-24 LAB — POCT INFLUENZA A/B
Influenza A, POC: NEGATIVE
Influenza B, POC: NEGATIVE

## 2019-04-24 NOTE — ED Provider Notes (Signed)
[facility]    CSN: [id number] Arrival date & time: 04/24/19  1336      History   Chief Complaint Chief Complaint  Patient presents with  . Joint Pain    HPI Randall Collins is a 53 y.o. male.   53 year old male comes in for 1 week history of body aches, fatigue, small cough. States symptoms first started after getting the pfizer vaccine, but has persisted.  Denies fever, chills. Denies abdominal pain, nausea, vomiting, diarrhea. Denies shortness of breath, loss of taste/smell.  Was Covid tested this morning by health at work.  States would like flu testing.     Past Medical History:  Diagnosis Date  . ADD (attention deficit disorder)   . ED (erectile dysfunction)   . Mixed hyperlipidemia     Patient Active Problem List   Diagnosis Date Noted  . Hyperlipidemia 05/23/2018  . Family history of heart disease 05/23/2018    History reviewed. No pertinent surgical history.     Home Medications    Prior to Admission medications   Medication Sig Start Date End Date Taking? Authorizing Provider  atorvastatin (LIPITOR) 80 MG tablet Take 1 tablet (80 mg total) by mouth daily. 06/28/18   Lorretta Harp, MD  VYVANSE 40 MG capsule Take 1 capsule by mouth every morning. 11/19/18   [provider]    Family History Family History  Problem Relation Age of Onset  . Diabetes Father   . Hypertension Father   . Heart attack Father   . Heart attack Paternal Grandfather   . Heart attack Brother     Social History Social History   Tobacco Use  . Smoking status: Never Smoker  . Smokeless tobacco: Never Used  Substance Use Topics  . Alcohol use: Yes    Comment: socially  . Drug use: Not on file     Allergies   Keflex [cephalexin]   Review of Systems Review of Systems  Reason unable to perform ROS: See HPI as above.     Physical Exam Triage Vital Signs ED Triage Vitals [04/24/19 1343]  Enc Vitals Group     BP (!) 147/81     Pulse  Rate 87     Resp 18     Temp 98 F (36.7 C)     Temp src      SpO2 95 %     Weight      Height      Head Circumference      Peak Flow      Pain Score 0     Pain Loc      Pain Edu?      Excl. in [place]?    No data found.  Updated Vital Signs BP (!) 147/81 (BP Location: Left Arm)   Pulse 87   Temp 98 F (36.7 C)   Resp 18   SpO2 95%   Physical Exam Constitutional:      General: He is not in acute distress.    Appearance: Normal appearance. He is not ill-appearing, toxic-appearing or diaphoretic.  HENT:     Head: Normocephalic and atraumatic.     Mouth/Throat:     Mouth: Mucous membranes are moist.     Pharynx: Oropharynx is clear. Uvula midline.  Cardiovascular:     Rate and Rhythm: Normal rate and regular rhythm.     Heart sounds: Normal heart sounds. No murmur. No friction rub. No gallop.   Pulmonary:  Effort: Pulmonary effort is normal. No accessory muscle usage, prolonged expiration, respiratory distress or retractions.     Comments: Lungs clear to auscultation without adventitious lung sounds. Musculoskeletal:     Cervical back: Normal range of motion and neck supple.  Skin:    General: Skin is warm and dry.  Neurological:     General: No focal deficit present.     Mental Status: He is alert and oriented to person, place, and time.    UC Treatments / Results  Labs (all labs ordered are listed, but only abnormal results are displayed) Labs Reviewed  POCT INFLUENZA A/B    EKG   Radiology No results found.  Procedures Procedures (including critical care time)  Medications Ordered in UC Medications - No data to display  Initial Impression / Assessment and Plan / UC Course  I have reviewed the triage vital signs and the nursing notes.  Pertinent labs & imaging results that were available during my care of the patient were reviewed by me and considered in my medical decision making (see chart for details).    Rapid flu negative.  Patient to remain  in quarantine until testing results return.  Symptomatic treatment discussed.  Return precautions given.  Final Clinical Impressions(s) / UC Diagnoses   Final diagnoses:  Generalized body aches   ED Prescriptions    None     PDMP not reviewed this encounter.   Belinda Fisher, PA-C 04/24/19 1428

## 2019-04-24 NOTE — ED Triage Notes (Signed)
Pt states received the COVID vaccine a week ago. States having joint pain, fatigue, small cough after vaccine. States is pending a COVID test.

## 2019-04-24 NOTE — Discharge Instructions (Signed)
Rapid flu negative. Remain in quarantine until COVID results return. Tylenol/motrin for body aches as needed. Follow with health at work for return date.

## 2019-04-29 ENCOUNTER — Emergency Department (HOSPITAL_COMMUNITY)
Admission: EM | Admit: 2019-04-29 | Discharge: 2019-04-29 | Disposition: A | Discharge status: Home / Self Care | Payer: No Typology Code available for payment source | Attending: Emergency Medicine | Admitting: Emergency Medicine

## 2019-04-29 ENCOUNTER — Other Ambulatory Visit: Payer: Self-pay

## 2019-04-29 ENCOUNTER — Emergency Department (HOSPITAL_COMMUNITY): Payer: No Typology Code available for payment source

## 2019-04-29 ENCOUNTER — Encounter (HOSPITAL_COMMUNITY): Payer: Self-pay

## 2019-04-29 DIAGNOSIS — R5383 Other fatigue: Secondary | ICD-10-CM | POA: Diagnosis present

## 2019-04-29 DIAGNOSIS — Z79899 Other long term (current) drug therapy: Secondary | ICD-10-CM | POA: Insufficient documentation

## 2019-04-29 DIAGNOSIS — Z20828 Contact with and (suspected) exposure to other viral communicable diseases: Secondary | ICD-10-CM | POA: Diagnosis not present

## 2019-04-29 DIAGNOSIS — R509 Fever, unspecified: Secondary | ICD-10-CM | POA: Diagnosis not present

## 2019-04-29 DIAGNOSIS — R531 Weakness: Secondary | ICD-10-CM | POA: Diagnosis not present

## 2019-04-29 DIAGNOSIS — R05 Cough: Secondary | ICD-10-CM | POA: Diagnosis not present

## 2019-04-29 DIAGNOSIS — M7918 Myalgia, other site: Secondary | ICD-10-CM | POA: Insufficient documentation

## 2019-04-29 DIAGNOSIS — R070 Pain in throat: Secondary | ICD-10-CM | POA: Diagnosis not present

## 2019-04-29 DIAGNOSIS — J189 Pneumonia, unspecified organism: Secondary | ICD-10-CM | POA: Insufficient documentation

## 2019-04-29 DIAGNOSIS — Z20822 Contact with and (suspected) exposure to covid-19: Secondary | ICD-10-CM

## 2019-04-29 LAB — CBC WITH DIFFERENTIAL/PLATELET
Abs Immature Granulocytes: 0.02 10*3/uL (ref 0.00–0.07)
Basophils Absolute: 0 10*3/uL (ref 0.0–0.1)
Basophils Relative: 0 %
Eosinophils Absolute: 0 10*3/uL (ref 0.0–0.5)
Eosinophils Relative: 0 %
HCT: 45 % (ref 39.0–52.0)
Hemoglobin: 15.3 g/dL (ref 13.0–17.0)
Immature Granulocytes: 0 %
Lymphocytes Relative: 9 %
Lymphs Abs: 0.6 10*3/uL — ABNORMAL LOW (ref 0.7–4.0)
MCH: 29.4 pg (ref 26.0–34.0)
MCHC: 34 g/dL (ref 30.0–36.0)
MCV: 86.4 fL (ref 80.0–100.0)
Monocytes Absolute: 0.4 10*3/uL (ref 0.1–1.0)
Monocytes Relative: 6 %
Neutro Abs: 5.7 10*3/uL (ref 1.7–7.7)
Neutrophils Relative %: 85 %
Platelets: 173 10*3/uL (ref 150–400)
RBC: 5.21 MIL/uL (ref 4.22–5.81)
RDW: 12.3 % (ref 11.5–15.5)
WBC: 6.8 10*3/uL (ref 4.0–10.5)
nRBC: 0 % (ref 0.0–0.2)

## 2019-04-29 LAB — FERRITIN: Ferritin: 378 ng/mL — ABNORMAL HIGH (ref 24–336)

## 2019-04-29 LAB — COMPREHENSIVE METABOLIC PANEL
ALT: 27 U/L (ref 0–44)
AST: 30 U/L (ref 15–41)
Albumin: 4.3 g/dL (ref 3.5–5.0)
Alkaline Phosphatase: 105 U/L (ref 38–126)
Anion gap: 11 (ref 5–15)
BUN: 14 mg/dL (ref 6–20)
CO2: 25 mmol/L (ref 22–32)
Calcium: 8.8 mg/dL — ABNORMAL LOW (ref 8.9–10.3)
Chloride: 102 mmol/L (ref 98–111)
Creatinine, Ser: 0.92 mg/dL (ref 0.61–1.24)
GFR calc Af Amer: >60 mL/min (ref >60)
GFR calc non Af Amer: >60 mL/min (ref >60)
Glucose, Bld: 115 mg/dL — ABNORMAL HIGH (ref 70–99)
Potassium: 3.8 mmol/L (ref 3.5–5.1)
Sodium: 138 mmol/L (ref 135–145)
Total Bilirubin: 1 mg/dL (ref 0.3–1.2)
Total Protein: 7.6 g/dL (ref 6.5–8.1)

## 2019-04-29 LAB — TRIGLYCERIDES: Triglycerides: 62 mg/dL (ref <150)

## 2019-04-29 LAB — C-REACTIVE PROTEIN: CRP: 9.6 mg/dL — ABNORMAL HIGH (ref <1.0)

## 2019-04-29 LAB — LACTIC ACID, PLASMA
Lactic Acid, Venous: 1.2 mmol/L (ref 0.5–1.9)
Lactic Acid, Venous: 1.1 mmol/L (ref 0.5–1.9)

## 2019-04-29 LAB — D-DIMER, QUANTITATIVE: D-Dimer, Quant: 1 ug/mL-FEU — ABNORMAL HIGH (ref 0.00–0.50)

## 2019-04-29 LAB — FIBRINOGEN: Fibrinogen: 745 mg/dL — ABNORMAL HIGH (ref 210–475)

## 2019-04-29 LAB — TROPONIN I (HIGH SENSITIVITY)
Troponin I (High Sensitivity): 3 ng/L (ref <18)
Troponin I (High Sensitivity): 4 ng/L (ref <18)

## 2019-04-29 LAB — PROCALCITONIN: Procalcitonin: 0.1 ng/mL

## 2019-04-29 LAB — SARS CORONAVIRUS 2 (TAT 6-24 HRS): SARS Coronavirus 2: NEGATIVE

## 2019-04-29 LAB — POC SARS CORONAVIRUS 2 AG -  ED: SARS Coronavirus 2 Ag: NEGATIVE

## 2019-04-29 LAB — LACTATE DEHYDROGENASE: LDH: 255 U/L — ABNORMAL HIGH (ref 98–192)

## 2019-04-29 LAB — BRAIN NATRIURETIC PEPTIDE: B Natriuretic Peptide: 16.6 pg/mL (ref 0.0–100.0)

## 2019-04-29 MED ORDER — DOXYCYCLINE HYCLATE 100 MG PO CAPS: 100.0000 mg | ORAL_CAPSULE | Freq: Two times a day (BID) | ORAL | 0 refills | Status: DC

## 2019-04-29 MED ORDER — AMOXICILLIN-POT CLAVULANATE 875-125 MG PO TABS: 1.0000 | ORAL_TABLET | Freq: Two times a day (BID) | ORAL | 0 refills | Status: DC

## 2019-04-29 MED ORDER — AMOXICILLIN-POT CLAVULANATE 875-125 MG PO TABS
1.0000 | ORAL_TABLET | Freq: Once | ORAL | Status: AC
  Administered 2019-04-29: 1 via ORAL
  Filled 2019-04-29: qty 1

## 2019-04-29 MED ORDER — IOHEXOL 350 MG/ML SOLN
100.0000 mL | Freq: Once | INTRAVENOUS | Status: AC | PRN
  Administered 2019-04-29: 80 mL via INTRAVENOUS

## 2019-04-29 MED ORDER — DOXYCYCLINE HYCLATE 100 MG PO TABS
100.0000 mg | ORAL_TABLET | Freq: Once | ORAL | Status: AC
  Administered 2019-04-29: 100 mg via ORAL
  Filled 2019-04-29: qty 1

## 2019-04-29 MED ORDER — SODIUM CHLORIDE 0.9 % IV BOLUS
1000.0000 mL | Freq: Once | INTRAVENOUS | Status: AC
  Administered 2019-04-29: 1000 mL via INTRAVENOUS

## 2019-04-29 NOTE — ED Notes (Signed)
Pt provided with ice chip

## 2019-04-29 NOTE — ED Notes (Signed)
Pt transported to CT ?

## 2019-04-29 NOTE — ED Triage Notes (Signed)
Pt reports getting Pfizer COVID vaccine 11 days ago. Pt reports fatigue, generalized body aches, and intermittent fever since. Pt states he developed a cough on Friday. Pt reports testing negative for COVID and flu last week.

## 2019-04-29 NOTE — Progress Notes (Deleted)
today

## 2019-04-29 NOTE — ED Notes (Signed)
Pt ambulated in room with a steady gait on RA. O2 saturations remained 97-99%. Dr. Manus Gunning notified.

## 2019-04-29 NOTE — ED Provider Notes (Signed)
[facility]-EMERGENCY DEPT Provider Note   CSN: [id number] Arrival date & time: 04/29/19  1530     History Chief Complaint  Patient presents with  . Fatigue  . Cough    Randall Collins is a 54 y.o. male.  Patient here with 12-day history of fatigue, body aches, intermittent fever up to 102.  Developed a cough several days ago.  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  States he was well before this.  Started feeling poorly today after.  He said no energy, fatigue, body aches, headache, cough and chills.  His cough is productive of clear mucus.  He denies any significant shortness of breath or chest pain.  Denies any known contacts but has been working in the ED.  Reportedly had a negative Covid swab last week.  States he was supposed to have another test today but was too weak to get out of bed.  He is concerned he needs IV fluids.  Has not had any vomiting or diarrhea.  No chest pain or shortness of breath.  The history is provided by the patient.  Cough Associated symptoms: chills, fever, headaches and sore throat   Associated symptoms: no chest pain, no myalgias, no rhinorrhea and no shortness of breath        Past Medical History:  Diagnosis Date  . ADD (attention deficit disorder)   . ED (erectile dysfunction)   . Mixed hyperlipidemia     Patient Active Problem List   Diagnosis Date Noted  . Hyperlipidemia 05/23/2018  . Family history of heart disease 05/23/2018    History reviewed. No pertinent surgical history.     Family History  Problem Relation Age of Onset  . Diabetes Father   . Hypertension Father   . Heart attack Father   . Heart attack Paternal Grandfather   . Heart attack Brother     Social History   Tobacco Use  . Smoking status: Never Smoker  . Smokeless tobacco: Never Used  Substance Use Topics  . Alcohol use: Yes    Comment: socially  . Drug use: Not on file    Home  Medications Prior to Admission medications   Medication Sig Start Date End Date Taking? Authorizing Provider  atorvastatin (LIPITOR) 80 MG tablet Take 1 tablet (80 mg total) by mouth daily. 06/28/18   Runell Gess, MD  VYVANSE 40 MG capsule Take 1 capsule by mouth every morning. 11/19/18   [provider]    Allergies    Keflex [cephalexin]  Review of Systems   Review of Systems  Constitutional: Positive for activity change, appetite change, chills, fatigue and fever.  HENT: Positive for congestion and sore throat. Negative for rhinorrhea.   Respiratory: Positive for cough. Negative for shortness of breath.   Cardiovascular: Negative for chest pain.  Gastrointestinal: Negative for abdominal pain, nausea and vomiting.  Genitourinary: Negative for dysuria and hematuria.  Musculoskeletal: Positive for arthralgias. Negative for myalgias.  Skin: Negative for wound.  Neurological: Positive for weakness and headaches.   all other systems are negative except as noted in the HPI and PMH.    Physical Exam Updated Vital Signs BP (!) 148/90 (BP Location: Left Arm)   Pulse (!) 104   Temp 99.4 F (37.4 C) (Oral)   Resp 16   SpO2 96%   Physical Exam Vitals and nursing note reviewed.  Constitutional:      General: He is not in  acute distress.    Appearance: He is well-developed. He is ill-appearing.     Comments: Ill-appearing but nontoxic  HENT:     Head: Normocephalic and atraumatic.     Mouth/Throat:     Pharynx: No oropharyngeal exudate.  Eyes:     Conjunctiva/sclera: Conjunctivae normal.     Pupils: Pupils are equal, round, and reactive to light.  Neck:     Comments: No meningismus. Cardiovascular:     Rate and Rhythm: Normal rate and regular rhythm.     Heart sounds: Normal heart sounds. No murmur.  Pulmonary:     Effort: Pulmonary effort is normal. No respiratory distress.     Breath sounds: Normal breath sounds.     Comments: No increased work of breathing,  speaking full sentences Abdominal:     Palpations: Abdomen is soft.     Tenderness: There is no abdominal tenderness. There is no guarding or rebound.  Musculoskeletal:        General: No tenderness. Normal range of motion.     Cervical back: Normal range of motion and neck supple.  Skin:    General: Skin is warm.  Neurological:     General: No focal deficit present.     Mental Status: He is alert and oriented to person, place, and time. Mental status is at baseline.     Cranial Nerves: No cranial nerve deficit.     Motor: No abnormal muscle tone.     Coordination: Coordination normal.     Comments: No ataxia on finger to nose bilaterally. No pronator drift. 5/5 strength throughout. CN 2-12 intact.Equal grip strength. Sensation intact.   Psychiatric:        Behavior: Behavior normal.     ED Results / Procedures / Treatments   Labs (all labs ordered are listed, but only abnormal results are displayed) Labs Reviewed  CBC WITH DIFFERENTIAL/PLATELET - Abnormal; Notable for the following components:      Result Value   Lymphs Abs 0.6 (*)    All other components within normal limits  COMPREHENSIVE METABOLIC PANEL - Abnormal; Notable for the following components:   Glucose, Bld 115 (*)    Calcium 8.8 (*)    All other components within normal limits  D-DIMER, QUANTITATIVE (NOT AT [facility]) - Abnormal; Notable for the following components:   D-Dimer, Quant 1.00 (*)    All other components within normal limits  LACTATE DEHYDROGENASE - Abnormal; Notable for the following components:   LDH 255 (*)    All other components within normal limits  FERRITIN - Abnormal; Notable for the following components:   Ferritin 378 (*)    All other components within normal limits  FIBRINOGEN - Abnormal; Notable for the following components:   Fibrinogen 745 (*)    All other components within normal limits  C-REACTIVE PROTEIN - Abnormal; Notable for the following components:   CRP 9.6 (*)    All other  components within normal limits  SARS CORONAVIRUS 2 (TAT 6-24 HRS)  CULTURE, BLOOD (ROUTINE X 2)  CULTURE, BLOOD (ROUTINE X 2)  LACTIC ACID, PLASMA  LACTIC ACID, PLASMA  PROCALCITONIN  TRIGLYCERIDES  BRAIN NATRIURETIC PEPTIDE  POC SARS CORONAVIRUS 2 AG -  ED  TROPONIN I (HIGH SENSITIVITY)  TROPONIN I (HIGH SENSITIVITY)    EKG EKG Interpretation  Date/Time:  Monday April 29 2019 17:28:42 EST Ventricular Rate:  96 PR Interval:    QRS Duration: 98 QT Interval:  327 QTC Calculation: 414 R Axis:  113 Text Interpretation: Sinus rhythm Right axis deviation Low voltage, precordial leads No previous ECGs available Confirmed by Glynn Octave [phone]) on 04/29/2019 5:55:56 PM   Radiology DG Chest Portable 1 View  Result Date: 04/29/2019 CLINICAL DATA:  Cough EXAM: PORTABLE CHEST 1 VIEW COMPARISON:  None. FINDINGS: There is an ill-defined airspace opacity in the left mid lung zone. There is no pneumothorax. No large pleural effusion. The heart size is borderline enlarged. There is no acute osseous abnormality. IMPRESSION: Airspace opacity in the left mid lung zone concerning for developing pneumonia. Electronically Signed   By: Katherine Mantle M.D.   On: 04/29/2019 17:25    Procedures Procedures (including critical care time)  Medications Ordered in ED Medications  sodium chloride 0.9 % bolus 1,000 mL (has no administration in time range)    ED Course  I have reviewed the triage vital signs and the nursing notes.  Pertinent labs & imaging results that were available during my care of the patient were reviewed by me and considered in my medical decision making (see chart for details).    MDM Rules/Calculators/A&P                     12 days of body aches, fatigue, cough, fever and chills with anorexia and poor appetite and activity level.  Stable vitals without hypoxia.  Concern for Covid.  Patient did receive vaccine 12 days ago began to feel ill after this. He could  conceivably could have been infected prior to his vaccine.  Patient in no distress.  No hypoxia.  He is given gentle hydration.  Labs are reassuring with mild elevation of inflammatory markers.  Chest x-ray is concerning for left midlung infiltrates.  Suspect coronavirus but we will treat for possible superimposed bacterial pneumonia as well.  Risk and benefits of CT scan discussed with patient he agrees to proceed.  CT showed no pulmonary embolism but does show multifocal infiltrate of left lung.  Patient be given antibiotics.  Covid is still a concern despite negative influenza test today.  He is able to ambulate without desaturation and is tolerating p.o. with no increased work of breathing or hypoxia.  We will treat with antibiotics.  Patient informed to quarantine himself while Covid tests are pending. Return to the ED with chest pain, shortness of breath, any other concerns or worsening symptoms.  Randall Collins was evaluated in Emergency Department on 04/29/2019 for the symptoms described in the history of present illness. He was evaluated in the context of the global COVID-19 pandemic, which necessitated consideration that the patient might be at risk for infection with the SARS-CoV-2 virus that causes COVID-19. Institutional protocols and algorithms that pertain to the evaluation of patients at risk for COVID-19 are in a state of rapid change based on information released by regulatory bodies including the CDC and federal and state organizations. These policies and algorithms were followed during the patient's care in the ED.  Final Clinical Impression(s) / ED Diagnoses Final diagnoses:  Suspected COVID-19 virus infection  Community acquired pneumonia of left lower lobe of lung    Rx / DC Orders ED Discharge Orders    None       Anela Bensman, Jeannett Senior, MD 04/29/19 2204

## 2019-04-29 NOTE — Discharge Instructions (Signed)
As we discussed, Covid is still a concern despite your negative testing today.  Keep yourself quarantined while your results are pending.  Take the antibiotics as prescribed and keep yourself hydrated.  Use Tylenol or Motrin as needed for aches and fevers.  Return to the ED with worsening symptoms.

## 2019-04-30 ENCOUNTER — Other Ambulatory Visit: Payer: No Typology Code available for payment source

## 2019-04-30 MED FILL — DOXYCYCLINE HYC 100 MG CAPS: 100 | 10 days supply | Qty: 20 | Fill #0

## 2019-04-30 MED FILL — AMOX-CLAV 875-125 MG TABLET: 875-125 | 10 days supply | Qty: 20 | Fill #0

## 2019-05-04 LAB — CULTURE, BLOOD (ROUTINE X 2)
Culture: NO GROWTH
Special Requests: ADEQUATE

## 2019-05-05 LAB — CULTURE, BLOOD (ROUTINE X 2)
Culture: NO GROWTH
Special Requests: ADEQUATE

## 2019-06-11 MED FILL — VYVANSE 40 MG CAPSULE: 40 | 30 days supply | Qty: 30 | Fill #0

## 2019-06-18 ENCOUNTER — Encounter: Payer: Self-pay | Admitting: Internal Medicine

## 2019-06-18 ENCOUNTER — Encounter: Payer: Self-pay | Admitting: Nurse Practitioner

## 2019-06-18 ENCOUNTER — Encounter: Payer: Self-pay | Admitting: Physician Assistant

## 2019-06-19 ENCOUNTER — Other Ambulatory Visit: Payer: Self-pay | Admitting: Nurse Practitioner

## 2019-06-19 ENCOUNTER — Other Ambulatory Visit: Payer: Self-pay

## 2019-06-19 ENCOUNTER — Other Ambulatory Visit: Payer: No Typology Code available for payment source

## 2019-06-19 DIAGNOSIS — E782 Mixed hyperlipidemia: Secondary | ICD-10-CM

## 2019-06-19 MED ORDER — TADALAFIL 20 MG PO TABS: 20.0000 mg | ORAL_TABLET | Freq: Every day | ORAL | 3 refills | Status: DC | PRN

## 2019-06-19 NOTE — Progress Notes (Signed)
Patient here for fasting labs ordered during his telehealth visit in August 2020.

## 2019-06-20 LAB — CMP14+EGFR
ALT: 28 IU/L (ref 0–44)
AST: 22 IU/L (ref 0–40)
Albumin/Globulin Ratio: 2.1 (ref 1.2–2.2)
Albumin: 4.7 g/dL (ref 3.8–4.9)
Alkaline Phosphatase: 101 IU/L (ref 39–117)
BUN/Creatinine Ratio: 12 (ref 9–20)
BUN: 11 mg/dL (ref 6–24)
Bilirubin Total: 0.5 mg/dL (ref 0.0–1.2)
CO2: 22 mmol/L (ref 20–29)
Calcium: 9.7 mg/dL (ref 8.7–10.2)
Chloride: 104 mmol/L (ref 96–106)
Creatinine, Ser: 0.94 mg/dL (ref 0.76–1.27)
GFR calc Af Amer: 107 mL/min/{1.73_m2} (ref >59)
GFR calc non Af Amer: 92 mL/min/{1.73_m2} (ref >59)
Globulin, Total: 2.2 g/dL (ref 1.5–4.5)
Glucose: 93 mg/dL (ref 65–99)
Potassium: 4.4 mmol/L (ref 3.5–5.2)
Sodium: 142 mmol/L (ref 134–144)
Total Protein: 6.9 g/dL (ref 6.0–8.5)

## 2019-06-20 LAB — LIPID PANEL
Chol/HDL Ratio: 6 ratio — ABNORMAL HIGH (ref 0.0–5.0)
Cholesterol, Total: 205 mg/dL — ABNORMAL HIGH (ref 100–199)
HDL: 34 mg/dL — ABNORMAL LOW (ref >39)
LDL Chol Calc (NIH): 121 mg/dL — ABNORMAL HIGH (ref 0–99)
Triglycerides: 285 mg/dL — ABNORMAL HIGH (ref 0–149)
VLDL Cholesterol Cal: 50 mg/dL — ABNORMAL HIGH (ref 5–40)

## 2019-06-20 MED FILL — TADALAFIL 20 MG TABS: 20 | 30 days supply | Qty: 30 | Fill #0

## 2019-06-25 ENCOUNTER — Telehealth (INDEPENDENT_AMBULATORY_CARE_PROVIDER_SITE_OTHER): Payer: No Typology Code available for payment source | Admitting: Cardiology

## 2019-06-25 ENCOUNTER — Encounter: Payer: Self-pay | Admitting: Cardiology

## 2019-06-25 VITALS — Ht 71.0 in | Wt 240.0 lb

## 2019-06-25 DIAGNOSIS — Z8249 Family history of ischemic heart disease and other diseases of the circulatory system: Secondary | ICD-10-CM | POA: Diagnosis not present

## 2019-06-25 DIAGNOSIS — E782 Mixed hyperlipidemia: Secondary | ICD-10-CM

## 2019-06-25 DIAGNOSIS — I2510 Atherosclerotic heart disease of native coronary artery without angina pectoris: Secondary | ICD-10-CM

## 2019-06-25 NOTE — Progress Notes (Signed)
Virtual Visit via Telephone Note   This visit type was conducted due to national recommendations for restrictions regarding the COVID-19 Pandemic (e.g. social distancing) in an effort to limit this patient's exposure and mitigate transmission in our community.  Due to his co-morbid illnesses, this patient is at least at moderate risk for complications without adequate follow up.  This format is felt to be most appropriate for this patient at this time.  The patient did not have access to video technology/had technical difficulties with video requiring transitioning to audio format only (telephone).  All issues noted in this document were discussed and addressed.  No physical exam could be performed with this format.  Please refer to the patient's chart for his  consent to telehealth for [facility].   Date:  06/25/2019   ID:  Randall Collins, DOB October 31, 1965, MRN [id number]  Patient Location: Home Provider Location: Home  PCP:  Bing Neighbors, FNP  Cardiologist:  Nanetta Batty, MD  Electrophysiologist:  None   Evaluation Performed:  Follow-Up Visit  Chief Complaint:  None- one year follow up  History of Present Illness:    Randall Collins is a 54 y.o. male with family history of early coronary disease who saw Dr. Gery Pray to be established in January 2020.  Coronary calcium scoring done February 2020 showed a calcium score of 169 which is the 90th percentile for age.  There was moderate calcification in the proximal LAD.  The patient has not had angina.  He does not smoke.  He is on a statin.  Recent lipid panel showed an LDL of 121 and an HDL of 34.  He does not exercise on a regular basis.  His BMI is 33.  He was contacted today for a 1 year follow-up.  He was in the emergency room in January with community-acquired pneumonia, he says he is recovered from that.  I talked to him today about his lipid panel and his cardiac risk factor profile.  I also asked him to monitor his blood  pressure, he thinks his systolic blood pressure is running in the 140s.  Right now based on his risk factors age and sex he is an almost 50% risk of having a cardiac event in his lifetime.  I suggested he monitor his blood pressures.  I will do another virtual visit with him in a few weeks.  I think he may benefit from the addition of Zetia to his Lipitor 80 mg.  I also suggested he consider strongly daily exercise.  If his systolic blood blood pressures consistently running greater than 130 or 135 he will probably need antihypertensive treatment as well.  I will review these recommendations with Dr. Allyson Sabal.  The patient does not have symptoms concerning for COVID-19 infection (fever, chills, cough, or new shortness of breath).    Past Medical History:  Diagnosis Date  . ADD (attention deficit disorder)   . ED (erectile dysfunction)   . Mixed hyperlipidemia    No past surgical history on file.   Current Meds  Medication Sig  . atorvastatin (LIPITOR) 80 MG tablet Take 1 tablet (80 mg total) by mouth daily.  . tadalafil (CIALIS) 20 MG tablet Take 1 tablet (20 mg total) by mouth daily as needed for erectile dysfunction.  Marland Kitchen VYVANSE 40 MG capsule Take 1 capsule by mouth every morning.     Allergies:   Keflex [cephalexin]   Social History   Tobacco Use  . Smoking status: Never Smoker  .  Smokeless tobacco: Never Used  Substance Use Topics  . Alcohol use: Yes    Comment: socially  . Drug use: Not on file     Family Hx: The patient's family history includes Diabetes in his father; Heart attack in his brother, father, and paternal grandfather; Hypertension in his father.  ROS:   Please see the history of present illness.    All other systems reviewed and are negative.   Prior CV studies:   The following studies were reviewed today: Coronary Ca++ scoring feb 2020  Labs/Other Tests and Data Reviewed:    EKG:  An ECG dated 04/30/2019 was personally reviewed today and demonstrated:   NSR, low voltage  Recent Labs: 04/29/2019: B Natriuretic Peptide 16.6; Hemoglobin 15.3; Platelets 173 06/19/2019: ALT 28; BUN 11; Creatinine, Ser 0.94; Potassium 4.4; Sodium 142   Recent Lipid Panel Lab Results  Component Value Date/Time   CHOL 205 (H) 06/19/2019 10:57 AM   TRIG 285 (H) 06/19/2019 10:57 AM   HDL 34 (L) 06/19/2019 10:57 AM   CHOLHDL 6.0 (H) 06/19/2019 10:57 AM   LDLCALC 121 (H) 06/19/2019 10:57 AM    Wt Readings from Last 3 Encounters:  06/25/19 240 lb (108.9 kg)  12/19/18 241 lb 6.4 oz (109.5 kg)  05/23/18 247 lb (112 kg)     Objective:    Vital Signs:  Ht 5\' 11"  (1.803 m)   Wt 240 lb (108.9 kg)   BMI 33.47 kg/m    VITAL SIGNS:  reviewed  ASSESSMENT & PLAN:    CAD- The patient has coronary disease as documented by his coronary calcium scoring in February 2020, and noted on a CT angiogram of his chest in January 2021 when he was admitted with pneumonia.  That CT suggested mild LAD disease.  He does not have symptoms of angina.    Fm Hx of CAD- His father and his brother have a history of MI  Dyslipidemia- LDL 121 on Lipitor 80 mg - consider addition of Zetia.  HTN- He tells me his last few B/P readings have shown a systolic of > 761  Overweight- BMI 33  Plan: I asked him to monitor his B/P over the next few weeks and keep a record. I'll discuss adding Zetia to Lipitor 80- his LDL goal is 70.   I'll see him in f/u in 4 weeks virtually.    COVID-19 Education: The signs and symptoms of COVID-19 were discussed with the patient and how to seek care for testing (follow up with PCP or arrange E-visit).  The importance of social distancing was discussed today.  Time:   Today, I have spent 15 minutes with the patient with telehealth technology discussing the above problems.     Medication Adjustments/Labs and Tests Ordered: Current medicines are reviewed at length with the patient today.  Concerns regarding medicines are outlined above.   Tests  Ordered: No orders of the defined types were placed in this encounter.   Medication Changes: No orders of the defined types were placed in this encounter.   Follow Up:  Virtual Visit  with me in 4 weeks-  Signed, Kerin Ransom, PA-C  06/25/2019 3:36 PM    [facility]

## 2019-06-25 NOTE — Patient Instructions (Addendum)
Medication Instructions:  Your physician recommends that you continue on your current medications as directed. Please refer to the Current Medication list given to you today.  *If you need a refill on your cardiac medications before your next appointment, please call your pharmacy*  Lab Work: NONE ordered at this time of appointment   If you have labs (blood work) drawn today and your tests are completely normal, you will receive your results only by: Marland Kitchen MyChart Message (if you have MyChart) OR . A paper copy in the mail If you have any lab test that is abnormal or we need to change your treatment, we will call you to review the results.  Testing/Procedures: NONE ordered at this time of appointment   Follow-Up: At [facility], you and your health needs are our priority.  As part of our continuing mission to provide you with exceptional heart care, we have created designated Provider Care Teams.  These Care Teams include your primary Cardiologist (physician) and Advanced Practice Providers (APPs -  Physician Assistants and Nurse Practitioners) who all work together to provide you with the care you need, when you need it.  We recommend signing up for the patient portal called "MyChart".  Sign up information is provided on this After Visit Summary.  MyChart is used to connect with patients for Virtual Visits (Telemedicine).  Patients are able to view lab/test results, encounter notes, upcoming appointments, etc.  Non-urgent messages can be sent to your provider as well.   To learn more about what you can do with MyChart, go to [email or web address].    Your next appointment:   4 week(s)  The format for your next appointment:   Virtual Visit   Provider:   Corine Shelter, PA-C  Other Instructions  Monitor blood pressure

## 2019-07-30 ENCOUNTER — Telehealth: Payer: No Typology Code available for payment source | Admitting: Cardiology

## 2019-07-30 MED FILL — VYVANSE 40 MG CAPSULE: 40 | 30 days supply | Qty: 30 | Fill #0

## 2019-09-09 MED FILL — TADALAFIL 20 MG TABS: 20 | 30 days supply | Qty: 30 | Fill #1

## 2019-09-09 MED FILL — VYVANSE 40 MG CAPSULE: 40 | 30 days supply | Qty: 30 | Fill #0

## 2019-11-06 MED FILL — VYVANSE 40 MG CAPSULE: 40 | 30 days supply | Qty: 30 | Fill #0

## 2019-12-03 ENCOUNTER — Other Ambulatory Visit (HOSPITAL_COMMUNITY): Payer: Self-pay | Admitting: Internal Medicine

## 2019-12-03 MED FILL — VYVANSE 60 MG CHEW: 60 | 30 days supply | Qty: 30 | Fill #0

## 2019-12-24 MED FILL — VYVANSE 60 MG CHEW: 60 | 30 days supply | Qty: 30 | Fill #0

## 2019-12-26 ENCOUNTER — Ambulatory Visit
Admission: EM | Admit: 2019-12-26 | Discharge: 2019-12-26 | Disposition: A | Discharge status: Home / Self Care | Payer: No Typology Code available for payment source | Attending: Emergency Medicine | Admitting: Emergency Medicine

## 2019-12-26 ENCOUNTER — Other Ambulatory Visit: Payer: Self-pay

## 2019-12-26 DIAGNOSIS — R300 Dysuria: Secondary | ICD-10-CM | POA: Insufficient documentation

## 2019-12-26 LAB — POCT URINALYSIS DIP (MANUAL ENTRY)
Bilirubin, UA: NEGATIVE
Blood, UA: NEGATIVE
Glucose, UA: NEGATIVE mg/dL
Ketones, POC UA: NEGATIVE mg/dL
Leukocytes, UA: NEGATIVE
Nitrite, UA: NEGATIVE
Protein Ur, POC: NEGATIVE mg/dL
Spec Grav, UA: >=1.03 — AB (ref 1.010–1.025)
Urobilinogen, UA: 0.2 E.U./dL
pH, UA: 5.5 (ref 5.0–8.0)

## 2019-12-26 NOTE — ED Triage Notes (Signed)
Pt c/o difficulty urinating with frequency x4-5 days.

## 2019-12-26 NOTE — ED Provider Notes (Signed)
[facility]    CSN: [id number] Arrival date & time: 12/26/19  1755      History   Chief Complaint Chief Complaint  Patient presents with  . Urinary Tract Infection    HPI Randall Collins is a 54 y.o. male  Presenting with dysuria x4-5 days.  Did have unprotected intercourse 3 weeks ago: Underwent STI testing 1 week thereafter.  Did have serologic positive for HSV 1.  Concern for possible lesions to pubic area.  No penile lesions, discharge, testicular pain or swelling.  Past Medical History:  Diagnosis Date  . ADD (attention deficit disorder)   . ED (erectile dysfunction)   . Mixed hyperlipidemia     Patient Active Problem List   Diagnosis Date Noted  . CAD (coronary artery disease) 06/25/2019  . Hyperlipidemia 05/23/2018  . Family history of heart disease 05/23/2018    History reviewed. No pertinent surgical history.     Home Medications    Prior to Admission medications   Medication Sig Start Date End Date Taking? Authorizing Provider  atorvastatin (LIPITOR) 80 MG tablet Take 1 tablet (80 mg total) by mouth daily. 06/28/18   Runell Gess, MD  tadalafil (CIALIS) 20 MG tablet Take 1 tablet (20 mg total) by mouth daily as needed for erectile dysfunction. 06/19/19 07/19/19  Claiborne Rigg, NP  VYVANSE 40 MG capsule Take 1 capsule by mouth every morning. 11/19/18   [provider]    Family History Family History  Problem Relation Age of Onset  . Diabetes Father   . Hypertension Father   . Heart attack Father   . Heart attack Paternal Grandfather   . Heart attack Brother     Social History Social History   Tobacco Use  . Smoking status: Never Smoker  . Smokeless tobacco: Never Used  Substance Use Topics  . Alcohol use: Yes    Comment: socially  . Drug use: Not on file     Allergies   Keflex [cephalexin]   Review of Systems As per HPI   Physical Exam Triage Vital Signs ED Triage Vitals  Enc Vitals Group     BP  12/26/19 1921 (!) 154/98     Pulse Rate 12/26/19 1921 83     Resp 12/26/19 1921 18     Temp 12/26/19 1921 98 F (36.7 C)     Temp Source 12/26/19 1921 Oral     SpO2 12/26/19 1921 96 %     Weight --      Height --      Head Circumference --      Peak Flow --      Pain Score 12/26/19 1926 0     Pain Loc --      Pain Edu? --      Excl. in GC? --    No data found.  Updated Vital Signs BP (!) 154/98 (BP Location: Left Arm)   Pulse 83   Temp 98 F (36.7 C) (Oral)   Resp 18   SpO2 96%   Visual Acuity Right Eye Distance:   Left Eye Distance:   Bilateral Distance:    Right Eye Near:   Left Eye Near:    Bilateral Near:     Physical Exam Constitutional:      General: He is not in acute distress. HENT:     Head: Normocephalic and atraumatic.  Eyes:     General: No scleral icterus.    Pupils: Pupils are equal, round,  and reactive to light.  Cardiovascular:     Rate and Rhythm: Normal rate.  Pulmonary:     Effort: Pulmonary effort is normal. No respiratory distress.     Breath sounds: No wheezing.  Abdominal:     Tenderness: There is no abdominal tenderness. There is no right CVA tenderness or left CVA tenderness.  Genitourinary:    Comments: Chaperone present: Mons pubis with 2, scattered single follicular lesions not consistent with HSV, more consistent with folliculitis.  No tenderness.  Self swab obtained Skin:    Coloration: Skin is not jaundiced or pale.  Neurological:     Mental Status: He is alert and oriented to person, place, and time.      UC Treatments / Results  Labs (all labs ordered are listed, but only abnormal results are displayed) Labs Reviewed  POCT URINALYSIS DIP (MANUAL ENTRY) - Abnormal; Notable for the following components:      Result Value   Spec Grav, UA >=1.030 (*)    All other components within normal limits  CYTOLOGY, (ORAL, ANAL, URETHRAL) ANCILLARY ONLY    EKG   Radiology No results found.  Procedures Procedures  (including critical care time)  Medications Ordered in UC Medications - No data to display  Initial Impression / Assessment and Plan / UC Course  I have reviewed the triage vital signs and the nursing notes.  Pertinent labs & imaging results that were available during my care of the patient were reviewed by me and considered in my medical decision making (see chart for details).     Urine unremarkable, cytology pending as this could have been an early false negative.  We will treat if indicated.  Treat supportively in the interim.  Return precautions discussed, pt verbalized understanding and is agreeable to plan. Final Clinical Impressions(s) / UC Diagnoses   Final diagnoses:  Dysuria   Discharge Instructions   None    ED Prescriptions    None     PDMP not reviewed this encounter.   Hall-Potvin, [place], [place] 12/26/19 2025

## 2019-12-31 LAB — CYTOLOGY, (ORAL, ANAL, URETHRAL) ANCILLARY ONLY
Chlamydia: NEGATIVE
Comment: NEGATIVE
Comment: NEGATIVE
Comment: NORMAL
Neisseria Gonorrhea: NEGATIVE
Trichomonas: NEGATIVE

## 2020-01-02 ENCOUNTER — Telehealth (INDEPENDENT_AMBULATORY_CARE_PROVIDER_SITE_OTHER): Payer: No Typology Code available for payment source | Admitting: Internal Medicine

## 2020-01-02 ENCOUNTER — Encounter: Payer: Self-pay | Admitting: Internal Medicine

## 2020-01-02 DIAGNOSIS — E782 Mixed hyperlipidemia: Secondary | ICD-10-CM

## 2020-01-02 DIAGNOSIS — Z1321 Encounter for screening for nutritional disorder: Secondary | ICD-10-CM

## 2020-01-02 DIAGNOSIS — L989 Disorder of the skin and subcutaneous tissue, unspecified: Secondary | ICD-10-CM

## 2020-01-02 NOTE — Progress Notes (Signed)
Virtual Visit via Telephone Note  I connected with Randall Collins, on 01/02/2020 at 1:34 PM by telephone due to the COVID-19 pandemic and verified that I am speaking with the correct person using two identifiers.   Consent: I discussed the limitations, risks, security and privacy concerns of performing an evaluation and management service by telephone and the availability of in person appointments. I also discussed with the patient that there may be a patient responsible charge related to this service. The patient expressed understanding and agreed to proceed.   Location of Patient: Home   Location of Provider: Clinic    Persons participating in Telemedicine visit: [facility] [place] Dr. Earlene Plater      History of Present Illness: Patient has a visit for f/u on skin concerns. Was seen on 9/2 by Urgent Care. Reports acute concerns have resolved. Although would like to see dermatology for evaluation/removal of some skin lesions around his chest and neck area.    Past Medical History:  Diagnosis Date  . ADD (attention deficit disorder)   . ED (erectile dysfunction)   . Mixed hyperlipidemia    Allergies  Allergen Reactions  . Keflex [Cephalexin] Rash    Current Outpatient Medications on File Prior to Visit  Medication Sig Dispense Refill  . atorvastatin (LIPITOR) 80 MG tablet Take 1 tablet (80 mg total) by mouth daily. 90 tablet 1  . tadalafil (CIALIS) 20 MG tablet Take 1 tablet (20 mg total) by mouth daily as needed for erectile dysfunction. 30 tablet 3  . VYVANSE 60 MG CHEW Chew 1 tablet by mouth daily.     No current facility-administered medications on file prior to visit.    Observations/Objective: NAD. Speaking clearly.  Work of breathing normal.  Alert and oriented. Mood appropriate.   Assessment and Plan: 1. Skin lesions - Ambulatory referral to Dermatology  2. Mixed hyperlipidemia Reports renewed compliance with Lipitor the past couple  months. Noted that prior LDL, cholesterol, and triglycerides were elevated. Return for fasting labs.  - Lipid panel; Future  3. 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 Patient requests screening for Mg deficiency as well.  - VITAMIN D 25 Hydroxy (Vit-D Deficiency, Fractures); Future - Magnesium; Future   Follow Up Instructions: Lab visit 9/16    I discussed the assessment and treatment plan with the patient. The patient was provided an opportunity to ask questions and all were answered. The patient agreed with the plan and demonstrated an understanding of the instructions.   The patient was advised to call back or seek an in-person evaluation if the symptoms worsen or if the condition fails to improve as anticipated.     I provided 14 minutes total of non-face-to-face time during this encounter including median intraservice time, reviewing previous notes, investigations, ordering medications, medical decision making, coordinating care and patient verbalized understanding at the end of the visit.    Marcy Siren, D.O. Primary Care at [facility]  01/02/2020, 1:34 PM

## 2020-01-09 ENCOUNTER — Other Ambulatory Visit: Payer: No Typology Code available for payment source

## 2020-01-20 ENCOUNTER — Other Ambulatory Visit: Payer: No Typology Code available for payment source

## 2020-02-04 ENCOUNTER — Ambulatory Visit: Payer: Self-pay

## 2020-02-13 ENCOUNTER — Other Ambulatory Visit: Payer: Self-pay

## 2020-02-13 ENCOUNTER — Other Ambulatory Visit (INDEPENDENT_AMBULATORY_CARE_PROVIDER_SITE_OTHER): Payer: No Typology Code available for payment source

## 2020-02-13 DIAGNOSIS — Z1321 Encounter for screening for nutritional disorder: Secondary | ICD-10-CM

## 2020-02-13 DIAGNOSIS — E782 Mixed hyperlipidemia: Secondary | ICD-10-CM | POA: Diagnosis not present

## 2020-02-13 NOTE — Progress Notes (Signed)
Patient here for fasting labs. 

## 2020-02-14 LAB — LIPID PANEL
Chol/HDL Ratio: 4.1 ratio (ref 0.0–5.0)
Cholesterol, Total: 154 mg/dL (ref 100–199)
HDL: 38 mg/dL — ABNORMAL LOW (ref >39)
LDL Chol Calc (NIH): 80 mg/dL (ref 0–99)
Triglycerides: 216 mg/dL — ABNORMAL HIGH (ref 0–149)
VLDL Cholesterol Cal: 36 mg/dL (ref 5–40)

## 2020-02-14 LAB — MAGNESIUM: Magnesium: 2.2 mg/dL (ref 1.6–2.3)

## 2020-02-14 LAB — VITAMIN D 25 HYDROXY (VIT D DEFICIENCY, FRACTURES): Vit D, 25-Hydroxy: 37.8 ng/mL (ref 30.0–100.0)

## 2020-02-19 ENCOUNTER — Ambulatory Visit: Payer: No Typology Code available for payment source

## 2020-03-07 MED FILL — VYVANSE 60 MG CHEW: 60 | 30 days supply | Qty: 30 | Fill #0

## 2020-04-16 ENCOUNTER — Emergency Department (HOSPITAL_COMMUNITY)
Admission: EM | Admit: 2020-04-16 | Discharge: 2020-04-16 | Disposition: A | Discharge status: Home / Self Care | Payer: No Typology Code available for payment source | Attending: Emergency Medicine | Admitting: Emergency Medicine

## 2020-04-16 ENCOUNTER — Encounter (HOSPITAL_COMMUNITY): Payer: Self-pay | Admitting: *Deleted

## 2020-04-16 ENCOUNTER — Other Ambulatory Visit: Payer: Self-pay

## 2020-04-16 ENCOUNTER — Emergency Department (HOSPITAL_BASED_OUTPATIENT_CLINIC_OR_DEPARTMENT_OTHER): Payer: No Typology Code available for payment source

## 2020-04-16 DIAGNOSIS — I2510 Atherosclerotic heart disease of native coronary artery without angina pectoris: Secondary | ICD-10-CM | POA: Diagnosis not present

## 2020-04-16 DIAGNOSIS — R202 Paresthesia of skin: Secondary | ICD-10-CM | POA: Insufficient documentation

## 2020-04-16 NOTE — ED Triage Notes (Signed)
Pt complains of right arm numbness and tingling x 3 weeks. He states symptoms seemed to go away initially but now feels constant. No injury, no swelling, no redness. PCP wanted pt evaluated for possible blood clot. He feels like his 3rd and 4th digits his left hand are slightly numb as well since last night.

## 2020-04-16 NOTE — ED Provider Notes (Signed)
[facility]-EMERGENCY DEPT Provider Note   CSN: [id number] Arrival date & time: 04/16/20  1421     History Chief Complaint  Patient presents with  . Numbness    Randall Collins is a 54 y.o. male.  HPI Patient is a 54 year old male with a medical history as noted below.  Who presents the emergency department due to numbness and tingling for the past 3 weeks.  Patient states he would wake up in the morning and experience right arm tingling as well as decreased sensation from the shoulder through all 5 fingertips.  This would last for about 30 minutes and then spontaneously alleviated.  Over the past 3 to 4 days his symptoms worsened and they are now constant throughout the day.  Now reports additional tingling in digits 3 and 4 of the left hand.  Patient notes a history of similar symptoms in the past when taking Ritalin for ADHD.  This was discontinued years ago and his symptoms resolved.  Patient notes starting Vyvanse about 4 to 5 months ago.  Patient discussed his symptoms with his PCP who recommended coming to the emergency department for blood clot evaluation.  Patient denies any decreased warmth or swelling in the right upper extremity.  No pain.  No history of blood clots.  Patient denies chest pain or shortness of breath.  No leg swelling or calf pain.    Past Medical History:  Diagnosis Date  . ADD (attention deficit disorder)   . ED (erectile dysfunction)   . Mixed hyperlipidemia     Patient Active Problem List   Diagnosis Date Noted  . CAD (coronary artery disease) 06/25/2019  . Hyperlipidemia 05/23/2018  . Family history of heart disease 05/23/2018    History reviewed. No pertinent surgical history.     Family History  Problem Relation Age of Onset  . Diabetes Father   . Hypertension Father   . Heart attack Father   . Heart attack Paternal Grandfather   . Heart attack Brother     Social History   Tobacco Use  . Smoking status: Never  Smoker  . Smokeless tobacco: Never Used  Substance Use Topics  . Alcohol use: Yes    Comment: socially    Home Medications Prior to Admission medications   Medication Sig Start Date End Date Taking? Authorizing Provider  atorvastatin (LIPITOR) 80 MG tablet Take 1 tablet (80 mg total) by mouth daily. 06/28/18   Runell Gess, MD  tadalafil (CIALIS) 20 MG tablet Take 1 tablet (20 mg total) by mouth daily as needed for erectile dysfunction. 06/19/19 07/19/19  Claiborne Rigg, NP  VYVANSE 60 MG CHEW Chew 1 tablet by mouth daily. 12/24/19   [provider]    Allergies    Keflex [cephalexin]  Review of Systems   Review of Systems  All other systems reviewed and are negative. Ten systems reviewed and are negative for acute change, except as noted in the HPI.   Physical Exam Updated Vital Signs BP (!) 168/95 (BP Location: Left Arm)   Pulse 73   Temp 97.8 F (36.6 C) (Oral)   Resp 18   SpO2 94%   Physical Exam Vitals and nursing note reviewed.  Constitutional:      General: He is not in acute distress.    Appearance: Normal appearance. He is normal weight. He is not ill-appearing, toxic-appearing or diaphoretic.  HENT:     Head: Normocephalic and atraumatic.     Right Ear:  External ear normal.     Left Ear: External ear normal.     Nose: Nose normal.     Mouth/Throat:     Mouth: Mucous membranes are moist.     Pharynx: Oropharynx is clear. No oropharyngeal exudate or posterior oropharyngeal erythema.  Eyes:     Extraocular Movements: Extraocular movements intact.  Neck:     Comments: No midline C, T, or L-spine tenderness. Cardiovascular:     Rate and Rhythm: Normal rate and regular rhythm.     Pulses: Normal pulses.     Heart sounds: Normal heart sounds. No murmur heard. No friction rub. No gallop.      Comments: 2+ radial pulses.  Good cap refill throughout the right hand.  Both hands are warm. Pulmonary:     Effort: Pulmonary effort is normal. No respiratory  distress.     Breath sounds: Normal breath sounds. No stridor. No wheezing, rhonchi or rales.  Abdominal:     General: Abdomen is flat.     Palpations: Abdomen is soft.     Tenderness: There is no abdominal tenderness.  Musculoskeletal:        General: Normal range of motion.     Cervical back: Normal range of motion and neck supple. No tenderness.  Skin:    General: Skin is warm and dry.  Neurological:     General: No focal deficit present.     Mental Status: He is alert and oriented to person, place, and time.     Comments: Mild subjective decrease in sensation noted diffusely along the right upper extremity as well as throughout the right hand.  Sensation is intact to light touch in both hands as well as both arms.  Strength is 5 out of 5 and symmetrical in the bilateral upper extremities.  Grip strength is 5 out of 5.  Negative Tinel's sign.  Psychiatric:        Mood and Affect: Mood normal.        Behavior: Behavior normal.    ED Results / Procedures / Treatments   Labs (all labs ordered are listed, but only abnormal results are displayed) Labs Reviewed - No data to display  EKG None  Radiology UE VENOUS DUPLEX ([facility] 7 am - 7 pm)  Result Date: 04/16/2020 UPPER VENOUS STUDY  Indications: numbness in arm and fingers Limitations: Body habitus. Comparison Study: No prior study Performing Technologist: Sherren Kernsandace Kanady RVS  Examination Guidelines: A complete evaluation includes B-mode imaging, spectral Doppler, color Doppler, and power Doppler as needed of all accessible portions of each vessel. Bilateral testing is considered an integral part of a complete examination. Limited examinations for reoccurring indications may be performed as noted.  Right Findings: +----------+------------+---------+-----------+----------+--------------+ RIGHT     CompressiblePhasicitySpontaneousProperties   Summary     +----------+------------+---------+-----------+----------+--------------+ IJV                       Yes       Yes                             +----------+------------+---------+-----------+----------+--------------+ Subclavian               Yes       Yes                             +----------+------------+---------+-----------+----------+--------------+ Axillary  Not visualized +----------+------------+---------+-----------+----------+--------------+ Brachial      Full                                                 +----------+------------+---------+-----------+----------+--------------+ Radial        Full                                                 +----------+------------+---------+-----------+----------+--------------+ Cephalic      Full                                                 +----------+------------+---------+-----------+----------+--------------+ Basilic       Full                                                 +----------+------------+---------+-----------+----------+--------------+  Summary:  Right: No evidence of deep vein thrombosis in the upper extremity. Axillary vein was not well visualized.  *See table(s) above for measurements and observations.    Preliminary    Procedures Procedures (including critical care time)  Medications Ordered in ED Medications - No data to display  ED Course  I have reviewed the triage vital signs and the nursing notes.  Pertinent labs & imaging results that were available during my care of the patient were reviewed by me and considered in my medical decision making (see chart for details).  Clinical Course as of 04/16/20 1904  Thu Apr 16, 2020  1728 Patient discussed with Dr. Napoleon Form with neurology.  Does not feel that MRI is warranted at this time.  Recommended consideration for impingement syndrome, thoracic outlet syndrome.  Based on my physical exam findings he does not feel that there is likely cord involvement based on patient's  complaints.  Recommends adjustment and sleeping positions.  Follow-up outpatient if symptoms persist.  Will provide neurological referral. [LJ]    Clinical Course User Index [LJ] Placido Sou, PA-C   MDM Rules/Calculators/A&P                          Patient is a 54 year old male who presents to the emergency department with paresthesias in the right upper extremity.  Physical exam is generally reassuring.  Patient does have some mild subjective decrease sensation throughout the right arm but sensation is intact to light touch throughout the bilateral upper extremities.  Strength is 5 out of 5 in both arms.  Grip strength is 5 out of 5.  No apparent deficits.  No midline spine pain.  Patient discussed with Dr. Napoleon Form with neurology.  Does not feel that MRI is warranted at this time based on my physical exam findings.  Recommended adjustment in sleep positions.  Follow-up outpatient if his symptoms persist.  Patient was initially sent to the emergency department by his PCP for possible DVT in the right upper extremity.  Patient is amenable to having an ultrasound performed in the right arm.  This was obtained showing  no evidence for DVT.  Feel the patient is safe for discharge at this time and he is agreeable.  Patient given neurology follow-up if his symptoms persist.  Recommended returning to the emergency department if his symptoms worsen.  His questions were answered and he was amicable at the time of discharge.  His vital signs are stable.  Final Clinical Impression(s) / ED Diagnoses Final diagnoses:  Paresthesias   Rx / DC Orders ED Discharge Orders    None       Placido Sou, PA-C 04/16/20 2030    Geoffery Lyons, MD 04/16/20 2317

## 2020-04-16 NOTE — Discharge Instructions (Addendum)
Like we discussed, I would recommend you start adjusting your sleeping position for the next few nights.  See if this helps with your symptoms.  If you find your symptoms persist, please reach out to your provider that is prescribing you Vyvanse.  I would recommend discussing your symptoms with them as well to see if they agree that this could be a possible cause.  Otherwise, if your symptoms persist, I have given you the contact information below for [facility].  Give them a call and schedule a follow-up appointment.  You can always return to the emergency department if your symptoms worsen.  It was a pleasure to meet you.

## 2020-04-16 NOTE — Progress Notes (Signed)
VASCULAR LAB    Right upper extremity duplex has been performed.  See CV proc for preliminary results.  Messaged results to [employer], PA-C via [employer], Welda Azzarello, RVT 04/16/2020, 8:17 PM

## 2020-04-28 ENCOUNTER — Telehealth: Payer: Self-pay | Admitting: Internal Medicine

## 2020-04-28 NOTE — Telephone Encounter (Signed)
Called patient and LVM letting patient know that their appointment for Jan 11th had been cancelled due to provider being out of office. Advised patient to call back [phone] to reschedule

## 2020-05-05 ENCOUNTER — Ambulatory Visit: Payer: No Typology Code available for payment source | Admitting: Internal Medicine

## 2020-05-21 MED FILL — VYVANSE 60 MG CHEW: 60 | 30 days supply | Qty: 30 | Fill #0

## 2020-05-26 ENCOUNTER — Other Ambulatory Visit (HOSPITAL_COMMUNITY): Payer: Self-pay | Admitting: Physician Assistant

## 2020-06-30 ENCOUNTER — Telehealth: Payer: Self-pay

## 2020-06-30 ENCOUNTER — Ambulatory Visit: Payer: No Typology Code available for payment source | Admitting: Neurology

## 2020-06-30 ENCOUNTER — Encounter: Payer: Self-pay | Admitting: Neurology

## 2020-06-30 NOTE — Telephone Encounter (Signed)
Pt no showed 06/30/20 appt with Dr. Frances Furbish.

## 2020-08-12 ENCOUNTER — Other Ambulatory Visit: Payer: Self-pay | Admitting: Internal Medicine

## 2020-08-14 ENCOUNTER — Other Ambulatory Visit (HOSPITAL_COMMUNITY): Payer: Self-pay

## 2020-09-17 ENCOUNTER — Other Ambulatory Visit: Payer: Self-pay | Admitting: Nurse Practitioner

## 2020-09-18 ENCOUNTER — Other Ambulatory Visit (HOSPITAL_COMMUNITY): Payer: Self-pay

## 2020-09-18 MED ORDER — BUPROPION HCL ER (XL) 150 MG PO TB24
1.0000 | ORAL_TABLET | Freq: Every day | ORAL | 0 refills | Status: DC
  Filled 2020-09-18: qty 30, 30d supply, fill #0

## 2020-10-02 ENCOUNTER — Encounter (HOSPITAL_COMMUNITY): Payer: Self-pay | Admitting: Emergency Medicine

## 2020-10-02 ENCOUNTER — Emergency Department (HOSPITAL_COMMUNITY): Payer: No Typology Code available for payment source

## 2020-10-02 ENCOUNTER — Inpatient Hospital Stay (HOSPITAL_COMMUNITY)
Admission: EM | Admit: 2020-10-02 | Discharge: 2020-10-04 | DRG: 964 | Disposition: A | Discharge status: Home / Self Care | Payer: No Typology Code available for payment source | Attending: General Surgery | Admitting: General Surgery

## 2020-10-02 DIAGNOSIS — S32029A Unspecified fracture of second lumbar vertebra, initial encounter for closed fracture: Secondary | ICD-10-CM | POA: Diagnosis present

## 2020-10-02 DIAGNOSIS — S37012A Minor contusion of left kidney, initial encounter: Secondary | ICD-10-CM | POA: Diagnosis present

## 2020-10-02 DIAGNOSIS — Z79899 Other long term (current) drug therapy: Secondary | ICD-10-CM

## 2020-10-02 DIAGNOSIS — Z8249 Family history of ischemic heart disease and other diseases of the circulatory system: Secondary | ICD-10-CM

## 2020-10-02 DIAGNOSIS — S06309A Unspecified focal traumatic brain injury with loss of consciousness of unspecified duration, initial encounter: Secondary | ICD-10-CM | POA: Diagnosis not present

## 2020-10-02 DIAGNOSIS — S37019A Minor contusion of unspecified kidney, initial encounter: Secondary | ICD-10-CM

## 2020-10-02 DIAGNOSIS — Z20822 Contact with and (suspected) exposure to covid-19: Secondary | ICD-10-CM | POA: Diagnosis present

## 2020-10-02 DIAGNOSIS — S32049A Unspecified fracture of fourth lumbar vertebra, initial encounter for closed fracture: Secondary | ICD-10-CM | POA: Diagnosis present

## 2020-10-02 DIAGNOSIS — F909 Attention-deficit hyperactivity disorder, unspecified type: Secondary | ICD-10-CM | POA: Diagnosis present

## 2020-10-02 DIAGNOSIS — I629 Nontraumatic intracranial hemorrhage, unspecified: Secondary | ICD-10-CM

## 2020-10-02 DIAGNOSIS — S32009A Unspecified fracture of unspecified lumbar vertebra, initial encounter for closed fracture: Secondary | ICD-10-CM

## 2020-10-02 DIAGNOSIS — N289 Disorder of kidney and ureter, unspecified: Secondary | ICD-10-CM | POA: Diagnosis present

## 2020-10-02 DIAGNOSIS — S32019A Unspecified fracture of first lumbar vertebra, initial encounter for closed fracture: Secondary | ICD-10-CM | POA: Diagnosis present

## 2020-10-02 DIAGNOSIS — Z881 Allergy status to other antibiotic agents status: Secondary | ICD-10-CM

## 2020-10-02 DIAGNOSIS — S2242XA Multiple fractures of ribs, left side, initial encounter for closed fracture: Secondary | ICD-10-CM | POA: Diagnosis present

## 2020-10-02 DIAGNOSIS — S2249XA Multiple fractures of ribs, unspecified side, initial encounter for closed fracture: Secondary | ICD-10-CM | POA: Diagnosis not present

## 2020-10-02 DIAGNOSIS — S32039A Unspecified fracture of third lumbar vertebra, initial encounter for closed fracture: Secondary | ICD-10-CM | POA: Diagnosis present

## 2020-10-02 DIAGNOSIS — E782 Mixed hyperlipidemia: Secondary | ICD-10-CM | POA: Diagnosis present

## 2020-10-02 DIAGNOSIS — I2510 Atherosclerotic heart disease of native coronary artery without angina pectoris: Secondary | ICD-10-CM | POA: Diagnosis present

## 2020-10-02 LAB — COMPREHENSIVE METABOLIC PANEL
ALT: 69 U/L — ABNORMAL HIGH (ref 0–44)
AST: 73 U/L — ABNORMAL HIGH (ref 15–41)
Albumin: 4.3 g/dL (ref 3.5–5.0)
Alkaline Phosphatase: 79 U/L (ref 38–126)
Anion gap: 10 (ref 5–15)
BUN: 11 mg/dL (ref 6–20)
CO2: 25 mmol/L (ref 22–32)
Calcium: 9.4 mg/dL (ref 8.9–10.3)
Chloride: 107 mmol/L (ref 98–111)
Creatinine, Ser: 1.24 mg/dL (ref 0.61–1.24)
GFR, Estimated: >60 mL/min (ref >60)
Glucose, Bld: 139 mg/dL — ABNORMAL HIGH (ref 70–99)
Potassium: 3.4 mmol/L — ABNORMAL LOW (ref 3.5–5.1)
Sodium: 142 mmol/L (ref 135–145)
Total Bilirubin: 0.5 mg/dL (ref 0.3–1.2)
Total Protein: 6.6 g/dL (ref 6.5–8.1)

## 2020-10-02 LAB — I-STAT CHEM 8, ED
BUN: 13 mg/dL (ref 6–20)
Calcium, Ion: 1.14 mmol/L — ABNORMAL LOW (ref 1.15–1.40)
Chloride: 106 mmol/L (ref 98–111)
Creatinine, Ser: 1.1 mg/dL (ref 0.61–1.24)
Glucose, Bld: 136 mg/dL — ABNORMAL HIGH (ref 70–99)
HCT: 45 % (ref 39.0–52.0)
Hemoglobin: 15.3 g/dL (ref 13.0–17.0)
Potassium: 3.5 mmol/L (ref 3.5–5.1)
Sodium: 144 mmol/L (ref 135–145)
TCO2: 27 mmol/L (ref 22–32)

## 2020-10-02 LAB — PROTIME-INR
INR: 0.9 (ref 0.8–1.2)
Prothrombin Time: 11.9 seconds (ref 11.4–15.2)

## 2020-10-02 LAB — CBC
HCT: 46.7 % (ref 39.0–52.0)
Hemoglobin: 16.3 g/dL (ref 13.0–17.0)
MCH: 30.3 pg (ref 26.0–34.0)
MCHC: 34.9 g/dL (ref 30.0–36.0)
MCV: 86.8 fL (ref 80.0–100.0)
Platelets: 315 10*3/uL (ref 150–400)
RBC: 5.38 MIL/uL (ref 4.22–5.81)
RDW: 12.8 % (ref 11.5–15.5)
WBC: 12.1 10*3/uL — ABNORMAL HIGH (ref 4.0–10.5)
nRBC: 0 % (ref 0.0–0.2)

## 2020-10-02 LAB — SAMPLE TO BLOOD BANK

## 2020-10-02 LAB — ETHANOL: Alcohol, Ethyl (B): <10 mg/dL (ref <10)

## 2020-10-02 LAB — LACTIC ACID, PLASMA: Lactic Acid, Venous: 2.6 mmol/L (ref 0.5–1.9)

## 2020-10-02 MED ORDER — IOHEXOL 300 MG/ML  SOLN
100.0000 mL | Freq: Once | INTRAMUSCULAR | Status: AC | PRN
  Administered 2020-10-02: 100 mL via INTRAVENOUS

## 2020-10-02 MED ORDER — FENTANYL CITRATE (PF) 100 MCG/2ML IJ SOLN
INTRAMUSCULAR | Status: AC
  Administered 2020-10-02: 50 ug via INTRAVENOUS
  Filled 2020-10-02: qty 2

## 2020-10-02 MED ORDER — ONDANSETRON HCL 4 MG/2ML IJ SOLN
4.0000 mg | Freq: Once | INTRAMUSCULAR | Status: AC
  Administered 2020-10-02: 4 mg via INTRAVENOUS
  Filled 2020-10-02: qty 2

## 2020-10-02 MED ORDER — HYDROMORPHONE HCL 1 MG/ML IJ SOLN
0.5000 mg | Freq: Once | INTRAMUSCULAR | Status: AC
  Administered 2020-10-02: 0.5 mg via INTRAVENOUS
  Filled 2020-10-02: qty 1

## 2020-10-02 MED ORDER — FENTANYL CITRATE (PF) 100 MCG/2ML IJ SOLN
50.0000 ug | Freq: Once | INTRAMUSCULAR | Status: AC
Start: 2020-10-02 — End: 2020-10-02

## 2020-10-02 NOTE — ED Notes (Signed)
Ok to remove Ccollar per [place] PA, EDP to follow up, wife at bedside
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
## 2020-10-02 NOTE — ED Notes (Signed)
Pt transported to CT ?
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
## 2020-10-03 DIAGNOSIS — Z20822 Contact with and (suspected) exposure to covid-19: Secondary | ICD-10-CM | POA: Diagnosis present

## 2020-10-03 DIAGNOSIS — S06309A Unspecified focal traumatic brain injury with loss of consciousness of unspecified duration, initial encounter: Secondary | ICD-10-CM | POA: Diagnosis present

## 2020-10-03 DIAGNOSIS — S32029A Unspecified fracture of second lumbar vertebra, initial encounter for closed fracture: Secondary | ICD-10-CM | POA: Diagnosis present

## 2020-10-03 DIAGNOSIS — I2510 Atherosclerotic heart disease of native coronary artery without angina pectoris: Secondary | ICD-10-CM | POA: Diagnosis present

## 2020-10-03 DIAGNOSIS — E782 Mixed hyperlipidemia: Secondary | ICD-10-CM | POA: Diagnosis present

## 2020-10-03 DIAGNOSIS — Z881 Allergy status to other antibiotic agents status: Secondary | ICD-10-CM | POA: Diagnosis not present

## 2020-10-03 DIAGNOSIS — S32049A Unspecified fracture of fourth lumbar vertebra, initial encounter for closed fracture: Secondary | ICD-10-CM | POA: Diagnosis present

## 2020-10-03 DIAGNOSIS — Z8249 Family history of ischemic heart disease and other diseases of the circulatory system: Secondary | ICD-10-CM | POA: Diagnosis not present

## 2020-10-03 DIAGNOSIS — F909 Attention-deficit hyperactivity disorder, unspecified type: Secondary | ICD-10-CM | POA: Diagnosis present

## 2020-10-03 DIAGNOSIS — N289 Disorder of kidney and ureter, unspecified: Secondary | ICD-10-CM | POA: Diagnosis present

## 2020-10-03 DIAGNOSIS — S2249XA Multiple fractures of ribs, unspecified side, initial encounter for closed fracture: Secondary | ICD-10-CM | POA: Diagnosis present

## 2020-10-03 DIAGNOSIS — S32019A Unspecified fracture of first lumbar vertebra, initial encounter for closed fracture: Secondary | ICD-10-CM | POA: Diagnosis present

## 2020-10-03 DIAGNOSIS — S37012A Minor contusion of left kidney, initial encounter: Secondary | ICD-10-CM | POA: Diagnosis present

## 2020-10-03 DIAGNOSIS — Z79899 Other long term (current) drug therapy: Secondary | ICD-10-CM | POA: Diagnosis not present

## 2020-10-03 DIAGNOSIS — S2242XA Multiple fractures of ribs, left side, initial encounter for closed fracture: Secondary | ICD-10-CM | POA: Diagnosis present

## 2020-10-03 DIAGNOSIS — S32039A Unspecified fracture of third lumbar vertebra, initial encounter for closed fracture: Secondary | ICD-10-CM | POA: Diagnosis present

## 2020-10-03 LAB — RESP PANEL BY RT-PCR (FLU A&B, COVID) ARPGX2
Influenza A by PCR: NEGATIVE
Influenza B by PCR: NEGATIVE
SARS Coronavirus 2 by RT PCR: NEGATIVE

## 2020-10-03 LAB — MRSA PCR SCREENING: MRSA by PCR: NEGATIVE

## 2020-10-03 LAB — CBC
HCT: 42.2 % (ref 39.0–52.0)
Hemoglobin: 14.9 g/dL (ref 13.0–17.0)
MCH: 30.2 pg (ref 26.0–34.0)
MCHC: 35.3 g/dL (ref 30.0–36.0)
MCV: 85.6 fL (ref 80.0–100.0)
Platelets: 242 10*3/uL (ref 150–400)
RBC: 4.93 MIL/uL (ref 4.22–5.81)
RDW: 13 % (ref 11.5–15.5)
WBC: 13.3 10*3/uL — ABNORMAL HIGH (ref 4.0–10.5)
nRBC: 0 % (ref 0.0–0.2)

## 2020-10-03 LAB — BASIC METABOLIC PANEL
Anion gap: 9 (ref 5–15)
BUN: 13 mg/dL (ref 6–20)
CO2: 24 mmol/L (ref 22–32)
Calcium: 9 mg/dL (ref 8.9–10.3)
Chloride: 106 mmol/L (ref 98–111)
Creatinine, Ser: 1 mg/dL (ref 0.61–1.24)
GFR, Estimated: >60 mL/min (ref >60)
Glucose, Bld: 149 mg/dL — ABNORMAL HIGH (ref 70–99)
Potassium: 4.1 mmol/L (ref 3.5–5.1)
Sodium: 139 mmol/L (ref 135–145)

## 2020-10-03 MED ORDER — SODIUM CHLORIDE 0.9 % IV SOLN: INTRAVENOUS | Status: DC

## 2020-10-03 MED ORDER — OXYCODONE HCL 5 MG PO TABS
10.0000 mg | ORAL_TABLET | ORAL | Status: DC | PRN
  Administered 2020-10-03 – 2020-10-04 (×3): 10 mg via ORAL
  Filled 2020-10-03 (×4): qty 2

## 2020-10-03 MED ORDER — METHOCARBAMOL 500 MG PO TABS
500.0000 mg | ORAL_TABLET | Freq: Four times a day (QID) | ORAL | Status: DC
  Administered 2020-10-03 – 2020-10-04 (×6): 500 mg via ORAL
  Filled 2020-10-03 (×6): qty 1

## 2020-10-03 MED ORDER — ONDANSETRON 4 MG PO TBDP: 4.0000 mg | ORAL_TABLET | Freq: Four times a day (QID) | ORAL | Status: DC | PRN

## 2020-10-03 MED ORDER — BUPROPION HCL ER (XL) 150 MG PO TB24
150.0000 mg | ORAL_TABLET | Freq: Every day | ORAL | Status: DC
  Administered 2020-10-03 – 2020-10-04 (×2): 150 mg via ORAL
  Filled 2020-10-03 (×2): qty 1

## 2020-10-03 MED ORDER — KETOROLAC TROMETHAMINE 15 MG/ML IJ SOLN: 15.0000 mg | Freq: Three times a day (TID) | INTRAMUSCULAR | Status: DC | PRN

## 2020-10-03 MED ORDER — ACETAMINOPHEN 500 MG PO TABS
1000.0000 mg | ORAL_TABLET | Freq: Four times a day (QID) | ORAL | Status: DC
  Administered 2020-10-03 – 2020-10-04 (×6): 1000 mg via ORAL
  Filled 2020-10-03 (×6): qty 2

## 2020-10-03 MED ORDER — ENOXAPARIN SODIUM 30 MG/0.3ML IJ SOSY: 30.0000 mg | PREFILLED_SYRINGE | Freq: Two times a day (BID) | INTRAMUSCULAR | Status: DC

## 2020-10-03 MED ORDER — ONDANSETRON HCL 4 MG/2ML IJ SOLN: 4.0000 mg | Freq: Four times a day (QID) | INTRAMUSCULAR | Status: DC | PRN

## 2020-10-03 MED ORDER — MORPHINE SULFATE (PF) 2 MG/ML IV SOLN: 2.0000 mg | INTRAVENOUS | Status: DC | PRN

## 2020-10-03 MED ORDER — OXYCODONE HCL 5 MG PO TABS
5.0000 mg | ORAL_TABLET | ORAL | Status: DC | PRN
Start: 2020-10-03 — End: 2020-10-05
  Filled 2020-10-03: qty 1
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
## 2020-10-03 NOTE — TOC CAGE-AID Note (Signed)
Transition of Care [facility]) - CAGE-AID Screening   Patient Details  Name: Acel Natzke MRN: [id number] Date of Birth: 02/01/1966  Transition of Care [facility]) CM/SW Contact:    Judie Bonus, RN Phone Number: 10/03/2020, 2:26 AM   Clinical Narrative:  Occasional etoh usage, does not require materials.   CAGE-AID Screening:    Have You Ever Felt You Ought to Cut Down on Your Drinking or Drug Use?: No Have People Annoyed You By Critizing Your Drinking Or Drug Use?: No Have You Felt Bad Or Guilty About Your Drinking Or Drug Use?: No Have You Ever Had a Drink or Used Drugs First Thing In The Morning to Steady Your Nerves or to Get Rid of a Hangover?: No CAGE-AID Score: 0  Substance Abuse Education Offered: No

## 2020-10-03 NOTE — Consult Note (Signed)
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 121/76, pulse 86, temperature 98.4 F (36.9 C), temperature source Oral, resp. rate 19, height 5\' 11"  (1.803 m), weight 108.9 kg, SpO2 91 %. Awake and alert.  Oriented and appropriate motor and sensory function intact.  Abdomen soft.  Chest benign.  Extremities free from injury or deformity.  Assessment/Plan: Status post multiple left-sided lumbar transverse process fractures.  Although this is very painful its not structurally significant.  Patient may be mobilized ad lib. without bracing.  Discharge when stable from trauma surgery standpoint.  Follow-up with me in a couple weeks.  Sherilyn CooterHenry A Menno Vanbergen 10/03/2020, 11:49 AM
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
## 2020-10-03 NOTE — Discharge Instructions (Addendum)
RIB FRACTURES  HOME INSTRUCTIONS   PAIN CONTROL:  Pain is best controlled by a usual combination of three different methods TOGETHER:  Ice/Heat Over the counter pain medication Prescription pain medication You may experience some swelling and bruising in area of broken ribs. Ice packs or heating pads (30-60 minutes up to 6 times a day) will help. Use ice for the first few days to help decrease swelling and bruising, then switch to heat to help relax tight/sore spots and speed recovery. Some people prefer to use ice alone, heat alone, alternating between ice & heat. Experiment to what works for you. Swelling and bruising can take several weeks to resolve.  It is helpful to take an over-the-counter pain medication regularly for the first few weeks. Choose one of the following that works best for you:  Naproxen (Aleve, etc) Two 220mg tabs twice a day Ibuprofen (Advil, etc) Three 200mg tabs four times a day (every meal & bedtime) Acetaminophen (Tylenol, etc) 500-650mg four times a day (every meal & bedtime) A prescription for pain medication (such as oxycodone, hydrocodone, etc) may be given to you upon discharge. Take your pain medication as prescribed.  If you are having problems/concerns with the prescription medicine (does not control pain, nausea, vomiting, rash, itching, etc), please call us [phone] to see if we need to switch you to a different pain medicine that will work better for you and/or control your side effect better. If you need a refill on your pain medication, please contact your pharmacy. They will contact our office to request authorization. Prescriptions will not be filled after 5 pm or on week-ends. Avoid getting constipated. When taking pain medications, it is common to experience some constipation. Increasing fluid intake and taking a fiber supplement (such as Metamucil, Citrucel, FiberCon, MiraLax, etc) 1-2 times a day regularly will usually help prevent this problem  from occurring. A mild laxative (prune juice, Milk of Magnesia, MiraLax, etc) should be taken according to package directions if there are no bowel movements after 48 hours.  Watch out for diarrhea. If you have many loose bowel movements, simplify your diet to bland foods & liquids for a few days. Stop any stool softeners and decrease your fiber supplement. Switching to mild anti-diarrheal medications (Kayopectate, Pepto Bismol) can help. If this worsens or does not improve, please call us. FOLLOW UP  If a follow up appointment is needed one will be scheduled for you. If none is needed with our trauma team, please follow up with your primary care provider within 2-3 weeks from discharge. Please call [facility] at [phone] if you have any questions about follow up.  If you have any orthopedic or other injuries you will need to follow up as outlined in your follow up instructions.   WHEN TO CALL US [phone]:  Poor pain control Reactions / problems with new medications (rash/itching, nausea, etc)  Fever over 101.5 F (38.5 C) Worsening swelling or bruising Worsening pain, productive cough, difficulty breathing or any other concerning symptoms  The clinic staff is available to answer your questions during regular business hours (8:30am-5pm). Please don't hesitate to call and ask to speak to one of our nurses for clinical concerns.  If you have a medical emergency, go to the nearest emergency room or call 911.  A surgeon from [facility] is always on call at the hospitals   [facility]  [address], [address], [place], [place] [address] ?  MAIN: [phone] ? TOLL FREE: [phone] ?  FAX [phone]  [email or web address]      Information on Rib Fractures  A rib fracture is a break or crack in one of the bones of the ribs. The ribs are long, curved bones that wrap around your chest and attach to your spine and your breastbone. The  ribs protect your heart, lungs, and other organs in the chest. A broken or cracked rib is often painful but is not usually serious. Most rib fractures heal on their own over time. However, rib fractures can be more serious if multiple ribs are broken or if broken ribs move out of place and push against other structures or organs. What are the causes? This condition is caused by: Repetitive movements with high force, such as pitching a baseball or having severe coughing spells. A direct blow to the chest, such as a sports injury, a car accident, or a fall. Cancer that has spread to the bones, which can weaken bones and cause them to break. What are the signs or symptoms? Symptoms of this condition include: Pain when you breathe in or cough. Pain when someone presses on the injured area. Feeling short of breath. How is this diagnosed? This condition is diagnosed with a physical exam and medical history. Imaging tests may also be done, such as: Chest X-ray. CT scan. MRI. Bone scan. Chest ultrasound. How is this treated? Treatment for this condition depends on the severity of the fracture. Most rib fractures usually heal on their own in 1-3 months. Sometimes healing takes longer if there is a cough that does not stop or if there are other activities that make the injury worse (aggravating factors). While you heal, you will be given medicines to control the pain. You will also be taught deep breathing exercises. Severe injuries may require hospitalization or surgery. Follow these instructions at home: Managing pain, stiffness, and swelling If directed, apply ice to the injured area. Put ice in a plastic bag. Place a towel between your skin and the bag. Leave the ice on for 20 minutes, 2-3 times a day. Take over-the-counter and prescription medicines only as told by your health care provider. Activity Avoid a lot of activity and any activities or movements that cause pain. Be careful during  activities and avoid bumping the injured rib. Slowly increase your activity as told by your health care provider. General instructions Do deep breathing exercises as told by your health care provider. This helps prevent pneumonia, which is a common complication of a broken rib. Your health care provider may instruct you to: Take deep breaths several times a day. Try to cough several times a day, holding a pillow against the injured area. Use a device called incentive spirometer to practice deep breathing several times a day. Drink enough fluid to keep your urine pale yellow. Do not wear a rib belt or binder. These restrict breathing, which can lead to pneumonia. Keep all follow-up visits as told by your health care provider. This is important. Contact a health care provider if: You have a fever. Get help right away if: You have difficulty breathing or you are short of breath. You develop a cough that does not stop, or you cough up thick or bloody sputum. You have nausea, vomiting, or pain in your abdomen. Your pain gets worse and medicine does not help. Summary A rib fracture is a break or crack in one of the bones of the ribs. A broken or cracked rib is   often painful but is not usually serious. Most rib fractures heal on their own over time. Treatment for this condition depends on the severity of the fracture. Avoid a lot of activity and any activities or movements that cause pain. This information is not intended to replace advice given to you by your health care provider. Make sure you discuss any questions you have with your health care provider. Document Released: 04/11/2005 Document Revised: 07/11/2016 Document Reviewed: 07/11/2016 Elsevier Interactive Patient Education  2019 [employer]  
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
## 2020-10-04 ENCOUNTER — Inpatient Hospital Stay (HOSPITAL_COMMUNITY): Payer: No Typology Code available for payment source

## 2020-10-04 ENCOUNTER — Other Ambulatory Visit: Payer: Self-pay | Admitting: General Surgery

## 2020-10-04 LAB — CBC
HCT: 39.6 % (ref 39.0–52.0)
Hemoglobin: 13.9 g/dL (ref 13.0–17.0)
MCH: 30.5 pg (ref 26.0–34.0)
MCHC: 35.1 g/dL (ref 30.0–36.0)
MCV: 86.8 fL (ref 80.0–100.0)
Platelets: 191 10*3/uL (ref 150–400)
RBC: 4.56 MIL/uL (ref 4.22–5.81)
RDW: 12.9 % (ref 11.5–15.5)
WBC: 9.2 10*3/uL (ref 4.0–10.5)
nRBC: 0 % (ref 0.0–0.2)

## 2020-10-04 MED ORDER — MORPHINE SULFATE (PF) 2 MG/ML IV SOLN: 2.0000 mg | INTRAVENOUS | Status: DC | PRN

## 2020-10-04 MED ORDER — POLYETHYLENE GLYCOL 3350 17 G PO PACK: 17.0000 g | PACK | Freq: Every day | ORAL | Status: DC | PRN

## 2020-10-04 MED ORDER — ACETAMINOPHEN 500 MG PO TABS: 1000.0000 mg | ORAL_TABLET | Freq: Four times a day (QID) | ORAL | 0 refills | Status: DC | PRN

## 2020-10-04 MED ORDER — ONDANSETRON 4 MG PO TBDP: 4.0000 mg | ORAL_TABLET | Freq: Three times a day (TID) | ORAL | 0 refills | Status: DC | PRN

## 2020-10-04 MED ORDER — OXYCODONE HCL 10 MG PO TABS: 5.0000 mg | ORAL_TABLET | Freq: Four times a day (QID) | ORAL | 0 refills | Status: DC | PRN

## 2020-10-04 MED ORDER — METHOCARBAMOL 500 MG PO TABS
500.0000 mg | ORAL_TABLET | Freq: Four times a day (QID) | ORAL | 0 refills | Status: DC
Start: 2020-10-04 — End: 2020-10-23

## 2020-10-04 MED ORDER — METHOCARBAMOL 500 MG PO TABS: 500.0000 mg | ORAL_TABLET | Freq: Three times a day (TID) | ORAL | 0 refills | Status: DC | PRN

## 2020-10-04 MED ORDER — OXYCODONE-ACETAMINOPHEN 10-325 MG PO TABS: 1.0000 | ORAL_TABLET | Freq: Four times a day (QID) | ORAL | 0 refills | Status: DC | PRN

## 2020-10-04 MED ORDER — ONDANSETRON 4 MG PO TBDP: 4.0000 mg | ORAL_TABLET | Freq: Four times a day (QID) | ORAL | 0 refills | Status: DC | PRN

## 2020-10-04 MED ORDER — OXYCODONE HCL 5 MG PO TABS: 5.0000 mg | ORAL_TABLET | Freq: Three times a day (TID) | ORAL | 0 refills | Status: DC | PRN
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
## 2020-10-04 NOTE — Progress Notes (Signed)
Overall doing well.  No headache.  Back pain well controlled.  Mobilizing well.  Overall stable from my standpoint.  Fine for discharge home.  Follow-up as needed with me.

## 2020-10-04 NOTE — Progress Notes (Signed)
Patient with discharge orders. IV removed and discharge paperwork reviewed with pt and spouse and all questions answered. Prescriptions electronically faxed to pharmacy. Pt escorted out via wheelchair with all belongings to private vehicle.

## 2020-10-05 ENCOUNTER — Telehealth: Payer: Self-pay
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
## 2020-10-05 NOTE — Telephone Encounter (Signed)
Transition Care Management Follow-up Telephone Call Date of discharge and from where: 10/04/2020, [facility]  How have you been since you were released from the hospital? He said he is mostly all right  Any questions or concerns? Yes-noted below regarding cost of medication  Items Reviewed: Did the pt receive and understand the discharge instructions provided? Yes  Medications obtained and verified? Yes  - he said he has all medications and did not have any questions about the med regime. His only concern was the cost of ondansetron - $87 at [facility].   Other? No  Any new allergies since your discharge? No  Dietary orders reviewed? No Do you have support at home? Yes   Home Care and Equipment/Supplies: Were home health services ordered? no If so, what is the name of the agency? N/a  Has the agency set up a time to come to the patient's home? not applicable Were any new equipment or medical supplies ordered?  No What is the name of the medical supply agency? N/a Were you able to get the supplies/equipment? not applicable Do you have any questions related to the use of the equipment or supplies? No  Has been referred to oupatient PT.   Functional Questionnaire: (I = Independent and D = Dependent) ADLs: independent   Follow up appointments reviewed:  PCP Hospital f/u appt confirmed?  He did not want to schedule a follow up at this time.  His PCP has left [facility] and he is not sure about establishing care with a new provider at [facility]   He explained that there have been multiple changes with PCPs at that clinic since he started going there.   Informed him that [facility] and mobile unit are available for follow up if he would like to be seen and has not decided on a PCP yet   Specialist Hospital f/u appt confirmed? Yes  Scheduled to see neurology- 12/09/2020. Needs to schedule appointment with neurosurgery.   Are transportation arrangements needed? No  - informed him about [facility] to  medical appointments if needed If their condition worsens, is the pt aware to call PCP or go to the Emergency Dept.? Yes Was the patient provided with contact information for the PCP's office or ED? Yes Was to pt encouraged to call back with questions or concerns? Yes

## 2020-10-06 ENCOUNTER — Other Ambulatory Visit: Payer: Self-pay | Admitting: *Deleted

## 2020-10-06 ENCOUNTER — Encounter: Payer: Self-pay | Admitting: *Deleted
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
## 2020-10-13 ENCOUNTER — Ambulatory Visit: Payer: No Typology Code available for payment source | Attending: Physician Assistant | Admitting: Physical Therapy

## 2020-10-13 ENCOUNTER — Encounter: Payer: Self-pay | Admitting: Physical Therapy

## 2020-10-13 ENCOUNTER — Other Ambulatory Visit: Payer: Self-pay

## 2020-10-13 DIAGNOSIS — M545 Low back pain, unspecified: Secondary | ICD-10-CM | POA: Diagnosis present

## 2020-10-13 DIAGNOSIS — M6281 Muscle weakness (generalized): Secondary | ICD-10-CM | POA: Diagnosis present

## 2020-10-13 DIAGNOSIS — M25552 Pain in left hip: Secondary | ICD-10-CM | POA: Insufficient documentation

## 2020-10-13 DIAGNOSIS — R2689 Other abnormalities of gait and mobility: Secondary | ICD-10-CM | POA: Diagnosis present

## 2020-10-13 NOTE — Patient Instructions (Signed)
Access Code: [email or web address] URL: [email or web address] Date: 10/13/2020 Prepared by: Alphonzo Severance  Exercises Hooklying Clamshell with Resistance - 1 x daily - 7 x weekly - 3 sets - 10 reps Supine Posterior Pelvic Tilt - 2 x daily - 7 x weekly - 2 sets - 10 reps - 5'' hold Tandem Stance - 1 x daily - 7 x weekly - 1 sets - 4 reps - 45'' hold
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
## 2020-10-14 ENCOUNTER — Telehealth: Payer: Self-pay | Admitting: Internal Medicine

## 2020-10-14 NOTE — Addendum Note (Signed)
Addended by: Fredderick Phenix on: 10/14/2020 03:11 PM   Modules accepted: Orders

## 2020-10-14 NOTE — Telephone Encounter (Signed)
Patient called making sure we have received the paper work from [employer] and [employer]. Informed patient we have received the forms. Patient was asking when they would be filled out. I informed him, that I was not sure since Amy Zonia Kief has never seen you and you have an upcoming appointment on October 23, 2020. Please call patient and advise.

## 2020-10-15 ENCOUNTER — Other Ambulatory Visit: Payer: Self-pay | Admitting: *Deleted
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
## 2020-10-21 ENCOUNTER — Ambulatory Visit: Payer: No Typology Code available for payment source | Admitting: Physical Therapy
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
## 2020-10-23 ENCOUNTER — Encounter: Payer: Self-pay | Admitting: Family

## 2020-10-23 ENCOUNTER — Ambulatory Visit (INDEPENDENT_AMBULATORY_CARE_PROVIDER_SITE_OTHER): Payer: No Typology Code available for payment source | Admitting: Family

## 2020-10-23 ENCOUNTER — Other Ambulatory Visit: Payer: Self-pay

## 2020-10-23 VITALS — BP 128/87 | HR 88 | Temp 98.1°F | Resp 16 | Ht 72.05 in | Wt 240.6 lb

## 2020-10-23 DIAGNOSIS — S060X0D Concussion without loss of consciousness, subsequent encounter: Secondary | ICD-10-CM

## 2020-10-23 DIAGNOSIS — N2889 Other specified disorders of kidney and ureter: Secondary | ICD-10-CM

## 2020-10-23 DIAGNOSIS — N289 Disorder of kidney and ureter, unspecified: Secondary | ICD-10-CM

## 2020-10-23 DIAGNOSIS — Z09 Encounter for follow-up examination after completed treatment for conditions other than malignant neoplasm: Secondary | ICD-10-CM

## 2020-10-23 NOTE — Progress Notes (Signed)
Pt presents for hospital f/u, needs a note will be returning to work  Pt needs refill on bupropion,

## 2020-10-28 ENCOUNTER — Ambulatory Visit: Payer: No Typology Code available for payment source | Attending: Physician Assistant

## 2020-10-28 ENCOUNTER — Other Ambulatory Visit: Payer: Self-pay

## 2020-10-28 DIAGNOSIS — M545 Low back pain, unspecified: Secondary | ICD-10-CM | POA: Diagnosis present

## 2020-10-28 DIAGNOSIS — M6281 Muscle weakness (generalized): Secondary | ICD-10-CM | POA: Insufficient documentation

## 2020-10-28 DIAGNOSIS — R2689 Other abnormalities of gait and mobility: Secondary | ICD-10-CM | POA: Insufficient documentation

## 2020-10-28 DIAGNOSIS — M25552 Pain in left hip: Secondary | ICD-10-CM | POA: Diagnosis present
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
## 2020-10-29 ENCOUNTER — Telehealth: Payer: Self-pay | Admitting: Family

## 2020-10-29 ENCOUNTER — Other Ambulatory Visit: Payer: Self-pay | Admitting: Family

## 2020-10-29 DIAGNOSIS — S060X0D Concussion without loss of consciousness, subsequent encounter: Secondary | ICD-10-CM

## 2020-10-29 NOTE — Telephone Encounter (Signed)
Referral updated to Neurology.
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
## 2020-11-09 ENCOUNTER — Other Ambulatory Visit: Payer: Self-pay

## 2020-11-09 ENCOUNTER — Encounter: Payer: Self-pay | Admitting: Family

## 2020-11-09 DIAGNOSIS — F32A Depression, unspecified: Secondary | ICD-10-CM

## 2020-11-09 DIAGNOSIS — N529 Male erectile dysfunction, unspecified: Secondary | ICD-10-CM

## 2020-11-09 MED ORDER — BUPROPION HCL ER (XL) 150 MG PO TB24: 150.0000 mg | ORAL_TABLET | Freq: Every day | ORAL | 0 refills | Status: DC

## 2020-11-09 MED ORDER — TADALAFIL 20 MG PO TABS: 20.0000 mg | ORAL_TABLET | Freq: Every day | ORAL | 0 refills | Status: DC | PRN

## 2020-11-11 ENCOUNTER — Ambulatory Visit: Payer: No Typology Code available for payment source

## 2020-11-17 ENCOUNTER — Ambulatory Visit (INDEPENDENT_AMBULATORY_CARE_PROVIDER_SITE_OTHER): Payer: No Typology Code available for payment source | Admitting: Physician Assistant

## 2020-11-17 ENCOUNTER — Encounter: Payer: No Typology Code available for payment source | Admitting: Physical Therapy

## 2020-11-17 ENCOUNTER — Other Ambulatory Visit: Payer: Self-pay

## 2020-11-17 ENCOUNTER — Other Ambulatory Visit: Payer: Self-pay | Admitting: Family

## 2020-11-17 VITALS — BP 138/94 | HR 75 | Temp 98.2°F | Resp 18 | Ht 71.0 in | Wt 243.0 lb

## 2020-11-17 DIAGNOSIS — N289 Disorder of kidney and ureter, unspecified: Secondary | ICD-10-CM

## 2020-11-17 DIAGNOSIS — N2889 Other specified disorders of kidney and ureter: Secondary | ICD-10-CM

## 2020-11-17 DIAGNOSIS — M549 Dorsalgia, unspecified: Secondary | ICD-10-CM

## 2020-11-17 DIAGNOSIS — R350 Frequency of micturition: Secondary | ICD-10-CM | POA: Diagnosis not present

## 2020-11-17 DIAGNOSIS — M546 Pain in thoracic spine: Secondary | ICD-10-CM | POA: Diagnosis not present

## 2020-11-17 DIAGNOSIS — N281 Cyst of kidney, acquired: Secondary | ICD-10-CM

## 2020-11-17 LAB — POCT URINALYSIS DIP (CLINITEK)
Bilirubin, UA: NEGATIVE
Blood, UA: NEGATIVE
Glucose, UA: NEGATIVE mg/dL
Ketones, POC UA: NEGATIVE mg/dL
Leukocytes, UA: NEGATIVE
Nitrite, UA: NEGATIVE
POC PROTEIN,UA: NEGATIVE
Spec Grav, UA: 1.015 (ref 1.010–1.025)
Urobilinogen, UA: 0.2 E.U./dL
pH, UA: 7 (ref 5.0–8.0)

## 2020-11-17 LAB — POCT GLYCOSYLATED HEMOGLOBIN (HGB A1C): Hemoglobin A1C: 5.1 % (ref 4.0–5.6)

## 2020-11-17 NOTE — Patient Instructions (Addendum)
I encourage you to follow-up with nephrology, here is the information [facility] # [phone] Address [address] [place] [address]   I encourage you to continue using the ibuprofen, doing physical therapy exercises to help with your back pain.  We will call you with the results of your urine culture.  I hope that you feel better soon Roney Jaffe, PA-C Physician Assistant [facility] [email or web address]   Acute Back Pain, Adult Acute back pain is sudden and usually short-lived. It is often caused by an injury to the muscles and tissues in the back. The injury may result from: A muscle or ligament getting overstretched or torn (strained). Ligaments are tissues that connect bones to each other. Lifting something improperly can cause a back strain. Wear and tear (degeneration) of the spinal disks. Spinal disks are circular tissue that provide cushioning between the bones of the spine (vertebrae). Twisting motions, such as while playing sports or doing yard work. A hit to the back. Arthritis. You may have a physical exam, lab tests, and imaging tests to find the cause ofyour pain. Acute back pain usually goes away with rest and home care. Follow these instructions at home: Managing pain, stiffness, and swelling Treatment may include medicines for pain and inflammation that are taken by mouth or applied to the skin, prescription pain medicine, or muscle relaxants. Take over-the-counter and prescription medicines only as told by your health care provider. Your health care provider may recommend applying ice during the first 24-48 hours after your pain starts. To do this: Put ice in a plastic bag. Place a towel between your skin and the bag. Leave the ice on for 20 minutes, 2-3 times a day. If directed, apply heat to the affected area as often as told by your health care provider. Use the heat source that your  health care provider recommends, such as a moist heat pack or a heating pad. Place a towel between your skin and the heat source. Leave the heat on for 20-30 minutes. Remove the heat if your skin turns bright red. This is especially important if you are unable to feel pain, heat, or cold. You have a greater risk of getting burned. Activity  Do not stay in bed. Staying in bed for more than 1-2 days can delay your recovery. Sit up and stand up straight. Avoid leaning forward when you sit or hunching over when you stand. If you work at a desk, sit close to it so you do not need to lean over. Keep your chin tucked in. Keep your neck drawn back, and keep your elbows bent at a 90-degree angle (right angle). Sit high and close to the steering wheel when you drive. Add lower back (lumbar) support to your car seat, if needed. Take short walks on even surfaces as soon as you are able. Try to increase the length of time you walk each day. Do not sit, drive, or stand in one place for more than 30 minutes at a time. Sitting or standing for long periods of time can put stress on your back. Do not drive or use heavy machinery while taking prescription pain medicine. Use proper lifting techniques. When you bend and lift, use positions that put less stress on your back: [place] your knees. Keep the load close to your body. Avoid twisting. Exercise regularly as told by your health care provider. Exercising helps your back heal faster and helps prevent back injuries by keeping muscles strong  and flexible. Work with a physical therapist to make a safe exercise program, as recommended by your health care provider. Do any exercises as told by your physical therapist.  Lifestyle Maintain a healthy weight. Extra weight puts stress on your back and makes it difficult to have good posture. Avoid activities or situations that make you feel anxious or stressed. Stress and anxiety increase muscle tension and can make back  pain worse. Learn ways to manage anxiety and stress, such as through exercise. General instructions Sleep on a firm mattress in a comfortable position. Try lying on your side with your knees slightly bent. If you lie on your back, put a pillow under your knees. Follow your treatment plan as told by your health care provider. This may include: Cognitive or behavioral therapy. Acupuncture or massage therapy. Meditation or yoga. Contact a health care provider if: You have pain that is not relieved with rest or medicine. You have increasing pain going down into your legs or buttocks. Your pain does not improve after 2 weeks. You have pain at night. You lose weight without trying. You have a fever or chills. Get help right away if: You develop new bowel or bladder control problems. You have unusual weakness or numbness in your arms or legs. You develop nausea or vomiting. You develop abdominal pain. You feel faint. Summary Acute back pain is sudden and usually short-lived. Use proper lifting techniques. When you bend and lift, use positions that put less stress on your back. Take over-the-counter and prescription medicines and apply heat or ice as directed by your health care provider. This information is not intended to replace advice given to you by your health care provider. Make sure you discuss any questions you have with your healthcare provider. Document Revised: 12/31/2019 Document Reviewed: 01/03/2020 Elsevier Patient Education  2022 [employer].
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
## 2020-11-17 NOTE — Progress Notes (Signed)
Patient reports urination frequency and odor over the last few weeks. Patient reports pain primarily in right side.  Patient reports kidney stone prior to having a CT that noted kidney lesion- Follow up MRI scheduled.

## 2020-11-18 ENCOUNTER — Encounter: Payer: Self-pay | Admitting: Physician Assistant

## 2020-11-19 LAB — URINE CULTURE: Organism ID, Bacteria: NO GROWTH

## 2020-11-20 ENCOUNTER — Encounter: Payer: Self-pay | Admitting: Physician Assistant

## 2020-11-23 ENCOUNTER — Other Ambulatory Visit: Payer: Self-pay

## 2020-11-23 ENCOUNTER — Encounter: Payer: Self-pay | Admitting: Family

## 2020-11-23 ENCOUNTER — Other Ambulatory Visit: Payer: Self-pay | Admitting: Physician Assistant

## 2020-11-23 DIAGNOSIS — M546 Pain in thoracic spine: Secondary | ICD-10-CM

## 2020-11-23 DIAGNOSIS — L739 Follicular disorder, unspecified: Secondary | ICD-10-CM

## 2020-11-23 MED ORDER — MUPIROCIN 2 % EX OINT: 1.0000 "application " | TOPICAL_OINTMENT | Freq: Three times a day (TID) | CUTANEOUS | 2 refills | Status: DC

## 2020-11-23 NOTE — Progress Notes (Signed)
Ref to ortho  Roney Jaffe, PA-C Physician Assistant [facility] Medicine [email or web address]

## 2020-11-25 ENCOUNTER — Telehealth: Payer: Self-pay | Admitting: Family

## 2020-11-25 NOTE — Telephone Encounter (Signed)
Kaylee from [facility] called stating per insurance company patient needs Prior Auth for MRI of Pelvis and MRI of Abd. Appointment is 11/26/2020 at 12pm.

## 2020-11-26 ENCOUNTER — Ambulatory Visit (HOSPITAL_COMMUNITY): Payer: No Typology Code available for payment source

## 2020-11-26 ENCOUNTER — Ambulatory Visit (HOSPITAL_COMMUNITY): Admission: RE | Admit: 2020-11-26 | Payer: No Typology Code available for payment source | Source: Ambulatory Visit

## 2020-11-26 ENCOUNTER — Telehealth: Payer: Self-pay

## 2020-11-26 NOTE — Telephone Encounter (Signed)
PA started for Mri of abdomen scheduled for 8/11 Case ID: [id number] Authorization pending

## 2020-11-27 ENCOUNTER — Other Ambulatory Visit (HOSPITAL_COMMUNITY): Payer: Self-pay

## 2020-11-27 ENCOUNTER — Other Ambulatory Visit: Payer: Self-pay | Admitting: Family

## 2020-11-30 ENCOUNTER — Other Ambulatory Visit: Payer: Self-pay | Admitting: Family

## 2020-11-30 ENCOUNTER — Other Ambulatory Visit (HOSPITAL_COMMUNITY): Payer: Self-pay

## 2020-11-30 DIAGNOSIS — F32A Depression, unspecified: Secondary | ICD-10-CM

## 2020-12-01 ENCOUNTER — Other Ambulatory Visit (HOSPITAL_COMMUNITY): Payer: Self-pay

## 2020-12-03 ENCOUNTER — Ambulatory Visit (HOSPITAL_COMMUNITY): Payer: No Typology Code available for payment source

## 2020-12-09 ENCOUNTER — Ambulatory Visit: Payer: No Typology Code available for payment source | Admitting: Diagnostic Neuroimaging

## 2020-12-23 ENCOUNTER — Other Ambulatory Visit (HOSPITAL_COMMUNITY): Payer: Self-pay

## 2020-12-31 ENCOUNTER — Other Ambulatory Visit (HOSPITAL_COMMUNITY): Payer: Self-pay

## 2020-12-31 ENCOUNTER — Other Ambulatory Visit: Payer: Self-pay | Admitting: Family

## 2020-12-31 DIAGNOSIS — N529 Male erectile dysfunction, unspecified: Secondary | ICD-10-CM

## 2020-12-31 MED ORDER — TADALAFIL 20 MG PO TABS
20.0000 mg | ORAL_TABLET | Freq: Every day | ORAL | 0 refills | Status: DC | PRN
  Filled 2020-12-31: qty 90, 90d supply, fill #0

## 2020-12-31 NOTE — Telephone Encounter (Signed)
Tadalafil (Cialis) refilled per patient request. Please schedule appointment for additional refills.  

## 2020-12-31 NOTE — Telephone Encounter (Signed)
Tadalafil (Cialis) refilled per patient request. Please schedule appointment for additional refills.

## 2021-02-16 ENCOUNTER — Encounter: Payer: Self-pay | Admitting: Family

## 2021-02-25 ENCOUNTER — Other Ambulatory Visit: Payer: Self-pay | Admitting: Family

## 2021-02-25 DIAGNOSIS — N529 Male erectile dysfunction, unspecified: Secondary | ICD-10-CM

## 2021-02-25 MED ORDER — TADALAFIL 20 MG PO TABS: 20.0000 mg | ORAL_TABLET | Freq: Every day | ORAL | 0 refills | Status: DC | PRN

## 2021-04-14 ENCOUNTER — Other Ambulatory Visit: Payer: Self-pay

## 2021-04-14 ENCOUNTER — Encounter: Payer: Self-pay | Admitting: Family

## 2021-04-14 ENCOUNTER — Ambulatory Visit (INDEPENDENT_AMBULATORY_CARE_PROVIDER_SITE_OTHER): Payer: No Typology Code available for payment source | Admitting: Family

## 2021-04-14 ENCOUNTER — Other Ambulatory Visit (HOSPITAL_COMMUNITY): Payer: Self-pay

## 2021-04-14 ENCOUNTER — Telehealth: Payer: Self-pay | Admitting: Family

## 2021-04-14 VITALS — BP 139/88 | HR 82 | Temp 98.3°F | Resp 18 | Ht 72.05 in | Wt 243.0 lb

## 2021-04-14 DIAGNOSIS — H65199 Other acute nonsuppurative otitis media, unspecified ear: Secondary | ICD-10-CM

## 2021-04-14 DIAGNOSIS — H9313 Tinnitus, bilateral: Secondary | ICD-10-CM | POA: Diagnosis not present

## 2021-04-14 MED ORDER — DOXYCYCLINE HYCLATE 100 MG PO TABS
100.0000 mg | ORAL_TABLET | Freq: Two times a day (BID) | ORAL | 0 refills | Status: AC
  Filled 2021-04-14: qty 14, 7d supply, fill #0

## 2021-04-14 NOTE — Patient Instructions (Signed)
Tinnitus Tinnitus refers to hearing a sound when there is no actual source for that sound. This is often described as ringing in the ears. However, people with this condition may hear a variety of noises, in one ear or in both ears. The sounds of tinnitus can be soft, loud, or somewhere in between. Tinnitus can last for a few seconds or can be constant for days. It may go away without treatment and come back at various times. When tinnitus is constant or happens often, it can lead to other problems, such as trouble sleeping and trouble concentrating. Almost everyone experiences tinnitus at some point. Tinnitus is not the same as hearing loss. Tinnitus that is long-lasting (chronic) or comes back often (recurs) may require medical attention. What are the causes? The cause of tinnitus is often not known. In some cases, it can result from: Exposure to loud noises from machinery, music, or other sources. An object (foreign body) stuck in the ear. Earwax buildup. Drinking alcohol or caffeine. Taking certain medicines. Age-related hearing loss. It may also be caused by medical conditions such as: Ear or sinus infections. Heart diseases or high blood pressure. Allergies. Mnire's disease. Thyroid problems. Tumors. A weak, bulging blood vessel (aneurysm) near the ear. What increases the risk? The following factors may make you more likely to develop this condition: Exposure to loud noises. Age. Tinnitus is more likely in older individuals. Using alcohol or tobacco. What are the signs or symptoms? The main symptom of tinnitus is hearing a sound when there is no source for that sound. It may sound like: Buzzing. Sizzling. Ringing. Blowing air. Hissing. Whistling. Other sounds may include: Roaring. Running water. A musical note. Tapping. Humming. Symptoms may affect only one ear (unilateral) or both ears (bilateral). How is this diagnosed? Tinnitus is diagnosed based on your symptoms,  your medical history, and a physical exam. Your health care provider may do a thorough hearing test (audiologic exam) if your tinnitus: Is unilateral. Causes hearing difficulties. Lasts 6 months or longer. You may work with a health care provider who specializes in hearing disorders (audiologist). You may be asked questions about your symptoms and how they affect your daily life. You may have other tests done, such as: CT scan. MRI. An imaging test of how blood flows through your blood vessels (angiogram). How is this treated? Treating an underlying medical condition can sometimes make tinnitus go away. If your tinnitus continues, other treatments may include: Therapy and counseling to help you manage the stress of living with tinnitus. Sound generators to mask the tinnitus. These include: Tabletop sound machines that play relaxing sounds to help you fall asleep. Wearable devices that fit in your ear and play sounds or music. Acoustic neural stimulation. This involves using headphones to listen to music that contains an auditory signal. Over time, listening to this signal may change some pathways in your brain and make you less sensitive to tinnitus. This treatment is used for very severe cases when no other treatment is working. Using hearing aids or cochlear implants if your tinnitus is related to hearing loss. Hearing aids are worn in the outer ear. Cochlear implants are surgically placed in the inner ear. Follow these instructions at home: Managing symptoms   When possible, avoid being in loud places and being exposed to loud sounds. Wear hearing protection, such as earplugs, when you are exposed to loud noises. Use a white noise machine, a humidifier, or other devices to mask the sound of tinnitus. Practice techniques   for reducing stress, such as meditation, yoga, or deep breathing. Work with your health care provider if you need help with managing stress. Sleep with your head slightly  raised. This may reduce the impact of tinnitus. General instructions Do not use stimulants, such as nicotine, alcohol, or caffeine. Talk with your health care provider about other stimulants to avoid. Stimulants are substances that can make you feel alert and attentive by increasing certain activities in the body (such as heart rate and blood pressure). These substances may make tinnitus worse. Take over-the-counter and prescription medicines only as told by your health care provider. Try to get plenty of sleep each night. Keep all follow-up visits. This is important. Contact a health care provider if: Your tinnitus continues for 3 weeks or longer without stopping. You develop sudden hearing loss. Your symptoms get worse or do not get better with home care. You feel you are not able to manage the stress of living with tinnitus. Get help right away if: You develop tinnitus after a head injury. You have tinnitus along with any of the following: Dizziness. Nausea and vomiting. Loss of balance. Sudden, severe headache. Vision changes. Facial weakness or weakness of arms or legs. These symptoms may represent a serious problem that is an emergency. Do not wait to see if the symptoms will go away. Get medical help right away. Call your local emergency services (911 in the [place] Do not drive yourself to the hospital. Summary Tinnitus refers to hearing a sound when there is no actual source for that sound. This is often described as ringing in the ears. Symptoms may affect only one ear (unilateral) or both ears (bilateral). Use a white noise machine, a humidifier, or other devices to mask the sound of tinnitus. Do not use stimulants, such as nicotine, alcohol, or caffeine. These substances may make tinnitus worse. This information is not intended to replace advice given to you by your health care provider. Make sure you discuss any questions you have with your health care provider. Document  Revised: 03/16/2020 Document Reviewed: 03/16/2020 Elsevier Patient Education  2022 [employer]  

## 2021-04-14 NOTE — Progress Notes (Signed)
Pt presents for ringing in right ear states started approx 5 days ago
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
## 2021-05-04 ENCOUNTER — Other Ambulatory Visit: Payer: Self-pay

## 2021-05-04 ENCOUNTER — Encounter: Payer: Self-pay | Admitting: Emergency Medicine

## 2021-05-04 ENCOUNTER — Telehealth: Payer: Self-pay | Admitting: Emergency Medicine

## 2021-05-04 ENCOUNTER — Other Ambulatory Visit (HOSPITAL_COMMUNITY): Payer: Self-pay

## 2021-05-04 ENCOUNTER — Ambulatory Visit
Admission: EM | Admit: 2021-05-04 | Discharge: 2021-05-04 | Disposition: A | Discharge status: Home / Self Care | Payer: No Typology Code available for payment source | Attending: Internal Medicine | Admitting: Internal Medicine

## 2021-05-04 DIAGNOSIS — L02212 Cutaneous abscess of back [any part, except buttock]: Secondary | ICD-10-CM

## 2021-05-04 MED ORDER — SULFAMETHOXAZOLE-TRIMETHOPRIM 800-160 MG PO TABS: 1.0000 | ORAL_TABLET | Freq: Two times a day (BID) | ORAL | 0 refills | Status: AC

## 2021-05-04 MED ORDER — CLINDAMYCIN HCL 150 MG PO CAPS: 300.0000 mg | ORAL_CAPSULE | Freq: Three times a day (TID) | ORAL | 0 refills | Status: AC

## 2021-05-04 MED ORDER — SULFAMETHOXAZOLE-TRIMETHOPRIM 800-160 MG PO TABS
1.0000 | ORAL_TABLET | Freq: Two times a day (BID) | ORAL | 0 refills | Status: DC
  Filled 2021-05-04: qty 14, 7d supply, fill #0

## 2021-05-04 NOTE — Discharge Instructions (Addendum)
You have an abscess to your back which is being treated with an antibiotic.  Please also use warm compresses to affected area.  Follow-up if symptoms persist or worsen.

## 2021-05-04 NOTE — Telephone Encounter (Signed)
Patient called after picking up clindamycin and stated that he was "resistant to clindamycin".  He began reading off medications that he was resistant to over the phone, and it appeared that he was reading off of a wound culture.  He reported that he was acceptable to Bactrim.  Will treat with Bactrim as patient has recently been treated with doxycycline.  Patient advised to not take clindamycin and to pick up Bactrim from pharmacy.  Patient voiced understanding.  All questions answered.

## 2021-05-04 NOTE — ED Triage Notes (Signed)
Pt here for possible cyst or abscess to mid back x 3 days with pain
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
## 2021-05-06 ENCOUNTER — Other Ambulatory Visit (HOSPITAL_COMMUNITY): Payer: Self-pay

## 2021-05-06 ENCOUNTER — Other Ambulatory Visit: Payer: Self-pay

## 2021-05-19 ENCOUNTER — Ambulatory Visit (INDEPENDENT_AMBULATORY_CARE_PROVIDER_SITE_OTHER): Payer: No Typology Code available for payment source | Admitting: Family Medicine

## 2021-05-19 ENCOUNTER — Other Ambulatory Visit: Payer: Self-pay

## 2021-05-19 ENCOUNTER — Encounter: Payer: Self-pay | Admitting: Family Medicine

## 2021-05-19 VITALS — BP 142/96 | HR 75 | Temp 97.3°F | Ht 72.0 in | Wt 250.6 lb

## 2021-05-19 DIAGNOSIS — I10 Essential (primary) hypertension: Secondary | ICD-10-CM

## 2021-05-19 DIAGNOSIS — Z0000 Encounter for general adult medical examination without abnormal findings: Secondary | ICD-10-CM

## 2021-05-19 DIAGNOSIS — I2510 Atherosclerotic heart disease of native coronary artery without angina pectoris: Secondary | ICD-10-CM | POA: Diagnosis not present

## 2021-05-19 DIAGNOSIS — L720 Epidermal cyst: Secondary | ICD-10-CM

## 2021-05-19 MED ORDER — CARVEDILOL 3.125 MG PO TABS: 3.1250 mg | ORAL_TABLET | Freq: Two times a day (BID) | ORAL | 1 refills | Status: DC

## 2021-05-19 NOTE — Progress Notes (Signed)
New Patient Office Visit  Subjective:  Patient ID: Randall Collins, male    DOB: 11/09/1965  Age: 56 y.o. MRN: [id number]  CC:  Chief Complaint  Patient presents with   Establish Care    NP/establish care would like bump on back checked little pain. Patient not fasting.     HPI Randall Collins presents for evaluation of a sore spot on his upper back area.  It is been there for about a month and is gradually enlarging.  Some spontaneous draining.  He was seen in the emergency room for this a few weeks ago and placed on clindamycin but then was switched to Septra.  He has completed his course of therapy.  History of inclusion cyst.  History of elevated LDL cholesterol with high calcium scores coronary arteries.  Strong family history of heart disease in his father and brother both at young ages.  Was taking atorvastatin at high dose but was lost to follow-up.  Has gained some weight.  Past Medical History:  Diagnosis Date   ADD (attention deficit disorder)    ED (erectile dysfunction)    Mixed hyperlipidemia     No past surgical history on file.  Family History  Problem Relation Age of Onset   Diabetes Father    Hypertension Father    Heart attack Father    Heart attack Paternal Grandfather    Heart attack Brother     Social History   Socioeconomic History   Marital status: Married    Spouse name: Not on file   Number of children: Not on file   Years of education: Not on file   Highest education level: Not on file  Occupational History   Not on file  Tobacco Use   Smoking status: Never   Smokeless tobacco: Never  Vaping Use   Vaping Use: Never used  Substance and Sexual Activity   Alcohol use: Yes    Comment: socially   Drug use: Never   Sexual activity: Yes    Partners: Female  Other Topics Concern   Not on file  Social History Narrative   Not on file   Social Determinants of Health   Financial Resource Strain: Not on file  Food Insecurity: Not on  file  Transportation Needs: Not on file  Physical Activity: Not on file  Stress: Not on file  Social Connections: Not on file  Intimate Partner Violence: Not on file    ROS Review of Systems  Constitutional:  Negative for diaphoresis, fatigue, fever and unexpected weight change.  HENT: Negative.    Eyes:  Negative for photophobia.  Respiratory: Negative.    Cardiovascular: Negative.   Gastrointestinal: Negative.   Genitourinary: Negative.   Skin:  Positive for color change.  Neurological:  Negative for speech difficulty and weakness.  Psychiatric/Behavioral: Negative.     Objective:   Today's Vitals: BP (!) 142/96 (BP Location: Right Arm, Patient Position: Sitting, Cuff Size: Large)    Pulse 75    Temp (!) 97.3 F (36.3 C) (Temporal)    Ht 6' (1.829 m)    Wt 250 lb 9.6 oz (113.7 kg)    SpO2 96%    BMI 33.99 kg/m   Physical Exam Vitals and nursing note reviewed.  Constitutional:      General: He is not in acute distress.    Appearance: Normal appearance. He is not ill-appearing, toxic-appearing or diaphoretic.  HENT:     Head: Normocephalic and atraumatic.  Eyes:  General: No scleral icterus.       Right eye: No discharge.        Left eye: No discharge.     Conjunctiva/sclera: Conjunctivae normal.  Pulmonary:     Effort: Pulmonary effort is normal.  Skin:    General: Skin is warm and dry.       Neurological:     Mental Status: He is alert and oriented to person, place, and time.   Incision and Drainage Procedure Note  Pre-operative Diagnosis: inclusion cyst  Post-operative Diagnosis: normal  Indications: swelling with ttp  Anesthesia: 2% plain lidocaine  Procedure Details  The procedure, risks and complications have been discussed in detail (including, but not limited to airway compromise, infection, bleeding) with the patient, and the patient has signed consent to the procedure.  The skin was sterilely prepped and draped over the affected area in the  usual fashion. After adequate local anesthesia, I&D with a #11 blade was performed on the upper back. Purulent drainage: absent The patient was observed until stable.  Findings: Caseous material with expressed cyst wall  EBL: 0 cc's  Drains: none  Condition: Tolerated procedure well   Complications: none.   Assessment & Plan:   Problem List Items Addressed This Visit       Cardiovascular and Mediastinum   CAD (coronary artery disease)   Relevant Medications   carvedilol (COREG) 3.125 MG tablet   Other Relevant Orders   CBC   Comprehensive metabolic panel   LDL cholesterol, direct   Lipid panel   Essential hypertension - Primary   Relevant Medications   carvedilol (COREG) 3.125 MG tablet     Other   Inclusion cyst   Relevant Orders   Wound culture   Other Visit Diagnoses     Healthcare maintenance       Relevant Orders   PSA   Urinalysis, Routine w reflex microscopic       Outpatient Encounter Medications as of 05/19/2021  Medication Sig   carvedilol (COREG) 3.125 MG tablet Take 1 tablet (3.125 mg total) by mouth 2 (two) times daily with a meal.   mupirocin ointment (BACTROBAN) 2 % Apply 1 application topically 3 (three) times daily.   tadalafil (CIALIS) 20 MG tablet Take 1 tablet (20 mg total) by mouth daily as needed for erectile dysfunction.   buPROPion (WELLBUTRIN XL) 150 MG 24 hr tablet Take 1 tablet by mouth daily (Patient not taking: Reported on 05/19/2021)   [DISCONTINUED] atorvastatin (LIPITOR) 80 MG tablet Take 1 tablet (80 mg total) by mouth daily. (Patient not taking: Reported on 10/23/2020)   No facility-administered encounter medications on file as of 05/19/2021.    Follow-up: Return return fasting for blood work and for physical and follow up.Mliss Sax, MD

## 2021-05-20 ENCOUNTER — Encounter: Payer: Self-pay | Admitting: Family Medicine

## 2021-05-23 LAB — WOUND CULTURE
MICRO NUMBER:: 12918074
RESULT:: NO GROWTH
SPECIMEN QUALITY:: ADEQUATE

## 2021-05-26 DIAGNOSIS — Z0000 Encounter for general adult medical examination without abnormal findings: Secondary | ICD-10-CM | POA: Insufficient documentation

## 2021-05-26 DIAGNOSIS — Z125 Encounter for screening for malignant neoplasm of prostate: Secondary | ICD-10-CM | POA: Insufficient documentation

## 2021-05-26 DIAGNOSIS — Z1159 Encounter for screening for other viral diseases: Secondary | ICD-10-CM | POA: Insufficient documentation

## 2021-06-03 ENCOUNTER — Encounter: Payer: Self-pay | Admitting: Family Medicine

## 2021-06-03 ENCOUNTER — Other Ambulatory Visit: Payer: Self-pay

## 2021-06-03 ENCOUNTER — Ambulatory Visit (INDEPENDENT_AMBULATORY_CARE_PROVIDER_SITE_OTHER): Payer: No Typology Code available for payment source | Admitting: Family Medicine

## 2021-06-03 VITALS — BP 132/84 | HR 75 | Temp 97.1°F | Ht 72.0 in | Wt 247.0 lb

## 2021-06-03 DIAGNOSIS — Z0000 Encounter for general adult medical examination without abnormal findings: Secondary | ICD-10-CM | POA: Diagnosis not present

## 2021-06-03 DIAGNOSIS — I10 Essential (primary) hypertension: Secondary | ICD-10-CM | POA: Diagnosis not present

## 2021-06-03 DIAGNOSIS — F32A Depression, unspecified: Secondary | ICD-10-CM

## 2021-06-03 DIAGNOSIS — Z114 Encounter for screening for human immunodeficiency virus [HIV]: Secondary | ICD-10-CM

## 2021-06-03 DIAGNOSIS — R6882 Decreased libido: Secondary | ICD-10-CM

## 2021-06-03 DIAGNOSIS — E782 Mixed hyperlipidemia: Secondary | ICD-10-CM | POA: Diagnosis not present

## 2021-06-03 DIAGNOSIS — Z1159 Encounter for screening for other viral diseases: Secondary | ICD-10-CM

## 2021-06-03 DIAGNOSIS — R41840 Attention and concentration deficit: Secondary | ICD-10-CM | POA: Diagnosis not present

## 2021-06-03 LAB — URINALYSIS, ROUTINE W REFLEX MICROSCOPIC
Bilirubin Urine: NEGATIVE
Hgb urine dipstick: NEGATIVE
Ketones, ur: NEGATIVE
Leukocytes,Ua: NEGATIVE
Nitrite: NEGATIVE
RBC / HPF: NONE SEEN (ref 0–?)
Specific Gravity, Urine: 1.025 (ref 1.000–1.030)
Total Protein, Urine: NEGATIVE
Urine Glucose: NEGATIVE
Urobilinogen, UA: 0.2 (ref 0.0–1.0)
pH: 5.5 (ref 5.0–8.0)

## 2021-06-03 LAB — LIPID PANEL
Cholesterol: 118 mg/dL (ref 0–200)
HDL: 37.6 mg/dL — ABNORMAL LOW (ref >39.00)
LDL Cholesterol: 42 mg/dL (ref 0–99)
NonHDL: 80.17
Total CHOL/HDL Ratio: 3
Triglycerides: 193 mg/dL — ABNORMAL HIGH (ref 0.0–149.0)
VLDL: 38.6 mg/dL (ref 0.0–40.0)

## 2021-06-03 LAB — COMPREHENSIVE METABOLIC PANEL
ALT: 31 U/L (ref 0–53)
AST: 22 U/L (ref 0–37)
Albumin: 4.5 g/dL (ref 3.5–5.2)
Alkaline Phosphatase: 85 U/L (ref 39–117)
BUN: 15 mg/dL (ref 6–23)
CO2: 30 mEq/L (ref 19–32)
Calcium: 9.5 mg/dL (ref 8.4–10.5)
Chloride: 107 mEq/L (ref 96–112)
Creatinine, Ser: 0.87 mg/dL (ref 0.40–1.50)
GFR: 96.9 mL/min (ref >60.00)
Glucose, Bld: 87 mg/dL (ref 70–99)
Potassium: 4 mEq/L (ref 3.5–5.1)
Sodium: 144 mEq/L (ref 135–145)
Total Bilirubin: 0.7 mg/dL (ref 0.2–1.2)
Total Protein: 6.9 g/dL (ref 6.0–8.3)

## 2021-06-03 LAB — LDL CHOLESTEROL, DIRECT: Direct LDL: 59 mg/dL

## 2021-06-03 LAB — CBC
HCT: 46.2 % (ref 39.0–52.0)
Hemoglobin: 15.9 g/dL (ref 13.0–17.0)
MCHC: 34.4 g/dL (ref 30.0–36.0)
MCV: 85.5 fl (ref 78.0–100.0)
Platelets: 255 10*3/uL (ref 150.0–400.0)
RBC: 5.41 Mil/uL (ref 4.22–5.81)
RDW: 13.4 % (ref 11.5–15.5)
WBC: 6.8 10*3/uL (ref 4.0–10.5)

## 2021-06-03 LAB — PSA: PSA: 2.72 ng/mL (ref 0.10–4.00)

## 2021-06-03 MED ORDER — BUPROPION HCL ER (XL) 150 MG PO TB24: 150.0000 mg | ORAL_TABLET | Freq: Every day | ORAL | 1 refills | Status: DC
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
## 2021-06-04 LAB — TESTOSTERONE TOTAL,FREE,BIO, MALES
Albumin: 4.6 g/dL (ref 3.6–5.1)
Sex Hormone Binding: 40 nmol/L (ref 10–50)
Testosterone, Bioavailable: 78.7 ng/dL — ABNORMAL LOW (ref 110.0–575.0)
Testosterone, Free: 37.5 pg/mL — ABNORMAL LOW (ref 46.0–224.0)
Testosterone: 344 ng/dL (ref 250–827)

## 2021-06-04 LAB — HEPATITIS C ANTIBODY
Hepatitis C Ab: NONREACTIVE
SIGNAL TO CUT-OFF: 0.06 (ref <1.00)

## 2021-06-04 LAB — HIV ANTIBODY (ROUTINE TESTING W REFLEX): HIV 1&2 Ab, 4th Generation: NONREACTIVE

## 2021-06-08 ENCOUNTER — Telehealth: Payer: Self-pay | Admitting: Family Medicine

## 2021-06-08 NOTE — Telephone Encounter (Signed)
Paper work received waiting to be viewed and signed.

## 2021-06-08 NOTE — Telephone Encounter (Signed)
Pt has dropped off paperwork for Dr. Ethelene Hal to fill out. [employer] form. I attached a green forn, he filled out and I placed paperwork in Dr.Kremer's folder.

## 2021-06-11 NOTE — Telephone Encounter (Signed)
Per Dr. Ethelene Hal patient was not evaluated by him regarding requested that patient will need to see provider who seen him to have form filled out. Called patient to inform. No answer LMTCB
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
## 2021-06-21 ENCOUNTER — Ambulatory Visit: Payer: No Typology Code available for payment source | Admitting: Nurse Practitioner

## 2021-07-06 ENCOUNTER — Ambulatory Visit: Payer: No Typology Code available for payment source | Admitting: Family

## 2021-08-05 ENCOUNTER — Ambulatory Visit: Payer: No Typology Code available for payment source | Admitting: Family Medicine

## 2021-08-24 ENCOUNTER — Encounter: Payer: Self-pay | Admitting: Family Medicine

## 2021-08-24 ENCOUNTER — Ambulatory Visit (INDEPENDENT_AMBULATORY_CARE_PROVIDER_SITE_OTHER): Payer: No Typology Code available for payment source | Admitting: Family Medicine

## 2021-08-24 VITALS — BP 138/86 | HR 77 | Temp 96.9°F | Ht 72.0 in | Wt 246.2 lb

## 2021-08-24 DIAGNOSIS — I2510 Atherosclerotic heart disease of native coronary artery without angina pectoris: Secondary | ICD-10-CM

## 2021-08-24 DIAGNOSIS — I10 Essential (primary) hypertension: Secondary | ICD-10-CM

## 2021-08-24 DIAGNOSIS — R41840 Attention and concentration deficit: Secondary | ICD-10-CM | POA: Diagnosis not present

## 2021-08-24 DIAGNOSIS — F32A Depression, unspecified: Secondary | ICD-10-CM

## 2021-08-24 DIAGNOSIS — G5601 Carpal tunnel syndrome, right upper limb: Secondary | ICD-10-CM | POA: Insufficient documentation

## 2021-08-24 DIAGNOSIS — E291 Testicular hypofunction: Secondary | ICD-10-CM | POA: Diagnosis not present

## 2021-08-24 MED ORDER — BUPROPION HCL ER (XL) 150 MG PO TB24: 150.0000 mg | ORAL_TABLET | Freq: Every day | ORAL | 1 refills | Status: DC

## 2021-08-24 MED ORDER — CARVEDILOL 3.125 MG PO TABS: 3.1250 mg | ORAL_TABLET | Freq: Two times a day (BID) | ORAL | 2 refills | Status: DC

## 2021-08-24 MED ORDER — TESTOSTERONE CYPIONATE 200 MG/ML IM SOLN: 200.0000 mg | INTRAMUSCULAR | 1 refills | Status: DC

## 2021-08-24 NOTE — Progress Notes (Signed)
? ?Established Patient Office Visit ? ?Subjective   ?Patient ID: Randall Collins, male    DOB: Aug 04, 1965  Age: 56 y.o. MRN: [id number] ? ?Chief Complaint  ?Patient presents with  ? Follow-up  ?  2 month f/u for bp. Only took meds for 1 week. Numbness in right arm and hand for the past 3 months. ?  ? ? ?HPI follow-up of hypertension, androgen deficiency, numbness in his right hand.  He is right-handed.  He has experienced this numbness on and off for the last 6 or 7 months.  More of the lateral part of the hand seems to be affected denies neck pain.  Patient discontinued the carvedilol after he read that it should not be stopped suddenly.  He has a history of coronary artery calcifications.  Recent testosterone levels were low normal with a low bioavailable testosterone.  He is taking testosterone in the past with positive effect. ? ? ? ?Review of Systems  ?Constitutional: Negative.   ?HENT: Negative.    ?Eyes:  Negative for blurred vision, discharge and redness.  ?Respiratory: Negative.    ?Cardiovascular: Negative.   ?Gastrointestinal:  Negative for abdominal pain.  ?Genitourinary: Negative.   ?Musculoskeletal: Negative.  Negative for joint pain, myalgias and neck pain.  ?Skin:  Negative for rash.  ?Neurological:  Positive for tingling. Negative for loss of consciousness, weakness and headaches.  ?Endo/Heme/Allergies:  Negative for polydipsia.  ? ?  ?Objective:  ?  ? ?BP 138/86 (BP Location: Right Arm, Patient Position: Sitting)   Pulse 77   Temp (!) 96.9 ?F (36.1 ?C) (Temporal)   Ht 6' (1.829 m)   Wt 246 lb 3.2 oz (111.7 kg)   SpO2 98%   BMI 33.39 kg/m?  ? ? ?Physical Exam ?Constitutional:   ?   General: He is not in acute distress. ?   Appearance: Normal appearance. He is not ill-appearing, toxic-appearing or diaphoretic.  ?HENT:  ?   Head: Normocephalic and atraumatic.  ?   Right Ear: External ear normal.  ?   Left Ear: External ear normal.  ?   Mouth/Throat:  ?   Mouth: Mucous membranes are moist.  ?    Pharynx: Oropharynx is clear. No oropharyngeal exudate or posterior oropharyngeal erythema.  ?Eyes:  ?   General: No scleral icterus.    ?   Right eye: No discharge.     ?   Left eye: No discharge.  ?   Extraocular Movements: Extraocular movements intact.  ?   Conjunctiva/sclera: Conjunctivae normal.  ?   Pupils: Pupils are equal, round, and reactive to light.  ?Cardiovascular:  ?   Rate and Rhythm: Normal rate and regular rhythm.  ?Pulmonary:  ?   Effort: Pulmonary effort is normal. No respiratory distress.  ?   Breath sounds: Normal breath sounds.  ?Abdominal:  ?   General: Bowel sounds are normal.  ?   Tenderness: There is no abdominal tenderness. There is no guarding.  ?Musculoskeletal:  ?   Right hand: No tenderness or bony tenderness. Normal range of motion. Normal strength. Normal capillary refill. Normal pulse.  ?     Arms: ? ?   Cervical back: No rigidity, spasms, tenderness or bony tenderness. No pain with movement. Normal range of motion.  ?     Back: ? ?Skin: ?   General: Skin is warm and dry.  ?Neurological:  ?   Mental Status: He is alert and oriented to person, place, and time.  ?Psychiatric:     ?  Mood and Affect: Mood normal.     ?   Behavior: Behavior normal.  ? ? ? ?No results found for any visits on 08/24/21. ? ? ? ?The ASCVD Risk score (Arnett DK, et al., 2019) failed to calculate for the following reasons: ?  The valid total cholesterol range is 130 to 320 mg/dL ? ?  ?Assessment & Plan:  ? ?Problem List Items Addressed This Visit   ? ?  ? Cardiovascular and Mediastinum  ? Coronary artery disease involving native coronary artery of native heart without angina pectoris  ? Relevant Medications  ? carvedilol (COREG) 3.125 MG tablet  ? Essential hypertension - Primary  ? Relevant Medications  ? carvedilol (COREG) 3.125 MG tablet  ?  ? Endocrine  ? Androgen deficiency  ? Relevant Medications  ? testosterone cypionate (DEPOTESTOSTERONE CYPIONATE) 200 MG/ML injection  ?  ? Nervous and Auditory  ?  Carpal tunnel syndrome of right wrist  ? Relevant Medications  ? buPROPion (WELLBUTRIN XL) 150 MG 24 hr tablet  ? Other Relevant Orders  ? Ambulatory referral to Sports Medicine  ?  ? Other  ? Attention deficit  ? Relevant Medications  ? buPROPion (WELLBUTRIN XL) 150 MG 24 hr tablet  ? ?Other Visit Diagnoses   ? ? Depression, unspecified depression type      ? Relevant Medications  ? buPROPion (WELLBUTRIN XL) 150 MG 24 hr tablet  ? ?  ? ? ?Return in about 2 months (around 10/24/2021).  ? ? ?Mliss Sax, MD ? ?

## 2021-09-01 ENCOUNTER — Encounter: Payer: Self-pay | Admitting: Family Medicine

## 2021-09-01 ENCOUNTER — Ambulatory Visit: Payer: No Typology Code available for payment source | Admitting: Family Medicine

## 2021-09-01 ENCOUNTER — Ambulatory Visit: Payer: Self-pay

## 2021-09-01 VITALS — BP 120/84 | Ht 72.0 in | Wt 246.0 lb

## 2021-09-01 DIAGNOSIS — M5412 Radiculopathy, cervical region: Secondary | ICD-10-CM | POA: Diagnosis not present

## 2021-09-01 DIAGNOSIS — G5601 Carpal tunnel syndrome, right upper limb: Secondary | ICD-10-CM

## 2021-09-01 NOTE — Assessment & Plan Note (Signed)
Acute on chronic in nature.  Does have pain in the upper arm which would be more consistent with a radicular symptom. ?-Counseled on home exercise therapy and supportive care. ?-Could consider further imaging or physical therapy. ?

## 2021-09-01 NOTE — Patient Instructions (Signed)
Nice to meet you ?Please try the exercises  ?Please try the brace at night   ?Please send me a message in MyChart with any questions or updates.  ?Please see me back in 4 weeks.  ? ?--Dr. Jordan Likes ? ?

## 2021-09-01 NOTE — Assessment & Plan Note (Signed)
Acute on chronic in nature.  Does have symptoms localized to the hand and are more consistent with carpal tunnel with the changes observed on ultrasound. ?-Counseled on home exercise therapy and supportive care. ?-Counseled on brace. ?-Injection today. ?-Could consider nerve study or physical therapy. ?

## 2021-09-01 NOTE — Progress Notes (Signed)
?  Randall Collins - 56 y.o. male MRN [id number]  Date of birth: 05-03-1965 ? ?SUBJECTIVE:  Including CC & ROS.  ?No chief complaint on file. ? ? ?Randall Collins is a 56 y.o. male that is presenting with right arm and right wrist pain.  The pain is acute on chronic in nature.  Seems to be worse with certain activities like playing his base.  The pain is worse in the morning. ? ? ?Review of Systems ?See HPI  ? ?HISTORY: Past Medical, Surgical, Social, and Family History Reviewed & Updated per EMR.   ?Pertinent Historical Findings include: ? ?Past Medical History:  ?Diagnosis Date  ? ADD (attention deficit disorder)   ? ED (erectile dysfunction)   ? Mixed hyperlipidemia   ? ? ?History reviewed. No pertinent surgical history. ? ? ?PHYSICAL EXAM:  ?VS: BP 120/84 (BP Location: Left Arm, Patient Position: Sitting)   Ht 6' (1.829 m)   Wt 246 lb (111.6 kg)   BMI 33.36 kg/m?  ?Physical Exam ?Gen: NAD, alert, cooperative with exam, well-appearing ?MSK:  ?Neurovascularly intact   ? ?Limited ultrasound: Right wrist: ? ?Median nerve measures 0.11 cm? in the carpal tunnel. ? ?Summary: Findings consistent with carpal tunnel syndrome ? ?Ultrasound and interpretation by Clare Gandy, MD ? ? ?Aspiration/Injection Procedure Note ?Rajvir Ernster [place] ?[birth year] ? ?Procedure: Injection ?Indications: Right carpal tunnel syndrome ? ?Procedure Details ?Consent: Risks of procedure as well as the alternatives and risks of each were explained to the (patient/caregiver).  Consent for procedure obtained. ?Time Out: Verified patient identification, verified procedure, site/side was marked, verified correct patient position, special equipment/implants available, medications/allergies/relevent history reviewed, required imaging and test results available.  Performed.  The area was cleaned with iodine and alcohol swabs.   ? ?The right carpal tunnel was injected using 1 cc's of 6 mg betamethasone, 2 cc of normal saline and 2 cc's of 0.25%  bupivacaine with a 25 1 1/2" needle.  Ultrasound was used. Images were obtained in long views showing the injection.   ? ? ?A sterile dressing was applied. ? ?Patient did tolerate procedure well. ? ? ? ?ASSESSMENT & PLAN:  ? ?Carpal tunnel syndrome of right wrist ?Acute on chronic in nature.  Does have symptoms localized to the hand and are more consistent with carpal tunnel with the changes observed on ultrasound. ?-Counseled on home exercise therapy and supportive care. ?-Counseled on brace. ?-Injection today. ?-Could consider nerve study or physical therapy. ? ?Cervical radiculopathy ?Acute on chronic in nature.  Does have pain in the upper arm which would be more consistent with a radicular symptom. ?-Counseled on home exercise therapy and supportive care. ?-Could consider further imaging or physical therapy. ? ? ? ? ?

## 2021-10-27 ENCOUNTER — Telehealth: Payer: Self-pay | Admitting: Family Medicine

## 2021-10-27 ENCOUNTER — Ambulatory Visit: Payer: No Typology Code available for payment source | Admitting: Family Medicine

## 2021-10-27 NOTE — Telephone Encounter (Signed)
Pt was a no show on 10/27/21 for an OV with Dr. Doreene Burke, I have sent a no show letter.

## 2021-10-29 ENCOUNTER — Ambulatory Visit (INDEPENDENT_AMBULATORY_CARE_PROVIDER_SITE_OTHER): Payer: No Typology Code available for payment source | Admitting: Family Medicine

## 2021-10-29 ENCOUNTER — Encounter: Payer: Self-pay | Admitting: Family Medicine

## 2021-10-29 VITALS — BP 138/102 | HR 74 | Temp 97.2°F | Ht 72.0 in | Wt 249.0 lb

## 2021-10-29 DIAGNOSIS — F32A Depression, unspecified: Secondary | ICD-10-CM | POA: Diagnosis not present

## 2021-10-29 DIAGNOSIS — T887XXA Unspecified adverse effect of drug or medicament, initial encounter: Secondary | ICD-10-CM

## 2021-10-29 DIAGNOSIS — Z23 Encounter for immunization: Secondary | ICD-10-CM | POA: Diagnosis not present

## 2021-10-29 DIAGNOSIS — I10 Essential (primary) hypertension: Secondary | ICD-10-CM | POA: Diagnosis not present

## 2021-10-29 DIAGNOSIS — E291 Testicular hypofunction: Secondary | ICD-10-CM

## 2021-10-29 DIAGNOSIS — R41840 Attention and concentration deficit: Secondary | ICD-10-CM

## 2021-10-29 LAB — CBC
HCT: 49.6 % (ref 39.0–52.0)
Hemoglobin: 16.8 g/dL (ref 13.0–17.0)
MCHC: 33.8 g/dL (ref 30.0–36.0)
MCV: 87.8 fl (ref 78.0–100.0)
Platelets: 247 10*3/uL (ref 150.0–400.0)
RBC: 5.66 Mil/uL (ref 4.22–5.81)
RDW: 14.1 % (ref 11.5–15.5)
WBC: 5.5 10*3/uL (ref 4.0–10.5)

## 2021-10-29 MED ORDER — OLMESARTAN MEDOXOMIL 20 MG PO TABS: 20.0000 mg | ORAL_TABLET | Freq: Every day | ORAL | 1 refills | Status: DC

## 2021-10-29 MED ORDER — BUPROPION HCL ER (XL) 150 MG PO TB24: 150.0000 mg | ORAL_TABLET | Freq: Every day | ORAL | 1 refills | Status: DC

## 2021-10-29 NOTE — Progress Notes (Signed)
Established Patient Office Visit  Subjective   Patient ID: Randall Collins, male    DOB: July 12, 1965  Age: 56 y.o. MRN: [id number]  Chief Complaint  Patient presents with   Office Visit    HTN & ADHD F/u  Doesn't check BP often needs refills on ointment and wellbutrin     HPI for follow-up of hypertension depression and attention deficit.  Decided to stop taking his medicines because he did not want to be dependent on them.  He had heard that when she started blood pressure medicine you have to stay on it.    Review of Systems  Constitutional: Negative.   HENT: Negative.    Eyes:  Negative for blurred vision, discharge and redness.  Respiratory: Negative.    Cardiovascular: Negative.   Gastrointestinal:  Negative for abdominal pain.  Genitourinary: Negative.   Musculoskeletal: Negative.  Negative for myalgias.  Skin:  Negative for rash.  Neurological:  Negative for tingling, loss of consciousness and weakness.  Endo/Heme/Allergies:  Negative for polydipsia.      Objective:     BP (!) 138/102 (BP Location: Left Arm, Patient Position: Sitting, Cuff Size: Normal)   Pulse 74   Temp (!) 97.2 F (36.2 C) (Temporal)   Ht 6' (1.829 m)   Wt 249 lb (112.9 kg)   SpO2 94%   BMI 33.77 kg/m    Physical Exam Vitals and nursing note reviewed.  Constitutional:      General: He is not in acute distress.    Appearance: He is well-developed. He is not diaphoretic.  HENT:     Head: Normocephalic and atraumatic.     Right Ear: External ear normal.     Left Ear: External ear normal.     Nose: Nose normal.     Mouth/Throat:     Pharynx: No oropharyngeal exudate.  Eyes:     General: No scleral icterus.       Right eye: No discharge.        Left eye: No discharge.     Conjunctiva/sclera: Conjunctivae normal.     Pupils: Pupils are equal, round, and reactive to light.  Neck:     Thyroid: No thyromegaly.     Vascular: No JVD.     Trachea: No tracheal deviation.   Cardiovascular:     Rate and Rhythm: Normal rate and regular rhythm.     Heart sounds: No murmur heard.    No friction rub. No gallop.  Pulmonary:     Effort: No respiratory distress.     Breath sounds: Normal breath sounds. No stridor. No wheezing or rales.  Musculoskeletal:        General: No tenderness or deformity. Normal range of motion.     Cervical back: Normal range of motion and neck supple.  Lymphadenopathy:     Cervical: No cervical adenopathy.  Skin:    General: Skin is warm and dry.     Findings: No rash.  Neurological:     Mental Status: He is alert and oriented to person, place, and time.     Deep Tendon Reflexes: Reflexes are normal and symmetric.  Psychiatric:        Behavior: Behavior normal.        Thought Content: Thought content normal.      No results found for any visits on 10/29/21.    The ASCVD Risk score (Arnett DK, et al., 2019) failed to calculate for the following reasons:   The valid total  cholesterol range is 130 to 320 mg/dL    Assessment & Plan:   Problem List Items Addressed This Visit       Cardiovascular and Mediastinum   Essential hypertension   Relevant Medications   olmesartan (BENICAR) 20 MG tablet     Endocrine   Androgen deficiency   Relevant Orders   CBC     Other   Attention deficit   Relevant Medications   buPROPion (WELLBUTRIN XL) 150 MG 24 hr tablet   Depression   Relevant Medications   buPROPion (WELLBUTRIN XL) 150 MG 24 hr tablet   Medication side effect   Relevant Orders   CBC   Need for Td vaccine - Primary   Relevant Orders   Tdap vaccine greater than or equal to 7yo IM (Completed)    Return in about 3 months (around 01/29/2022), or Please fast for next visit..  We will change to olmesartan for daily dosing to help hopefully improve compliance.  Explained that weight loss can help hypertension.  Given information on managing hypertension as well as olmesartan.  Restart Wellbutrin and take it daily for  depression and attention deficit.  Rechecking CBC regarding androgen use.  Urged compliance.  Mliss Sax, MD

## 2021-11-03 NOTE — Telephone Encounter (Signed)
Pt advised.

## 2021-11-22 ENCOUNTER — Other Ambulatory Visit: Payer: Self-pay

## 2021-11-22 ENCOUNTER — Telehealth: Payer: Self-pay | Admitting: Family Medicine

## 2021-11-22 DIAGNOSIS — F32A Depression, unspecified: Secondary | ICD-10-CM

## 2021-11-22 DIAGNOSIS — R41840 Attention and concentration deficit: Secondary | ICD-10-CM

## 2021-11-22 MED ORDER — BUPROPION HCL ER (XL) 150 MG PO TB24: 150.0000 mg | ORAL_TABLET | Freq: Every day | ORAL | 1 refills | Status: DC

## 2021-11-22 NOTE — Telephone Encounter (Signed)
Caller Name: [facility] Call back phone #: [phone]  Reason for Call: for His insurance to cover his Bupropion it has to be a 90 day supply. Please send in new order.

## 2021-11-22 NOTE — Telephone Encounter (Signed)
Requested Rx sent in patient aware 
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
## 2021-11-26 NOTE — Telephone Encounter (Signed)
1st no show, fee waived, letter sent 

## 2021-12-27 ENCOUNTER — Other Ambulatory Visit: Payer: Self-pay | Admitting: Family

## 2021-12-27 DIAGNOSIS — N529 Male erectile dysfunction, unspecified: Secondary | ICD-10-CM

## 2021-12-28 ENCOUNTER — Telehealth: Payer: Self-pay | Admitting: Family Medicine

## 2021-12-28 ENCOUNTER — Other Ambulatory Visit (HOSPITAL_BASED_OUTPATIENT_CLINIC_OR_DEPARTMENT_OTHER): Payer: Self-pay

## 2021-12-28 DIAGNOSIS — I10 Essential (primary) hypertension: Secondary | ICD-10-CM

## 2021-12-28 DIAGNOSIS — E291 Testicular hypofunction: Secondary | ICD-10-CM

## 2021-12-28 NOTE — Telephone Encounter (Signed)
Caller Name: Randall Collins Call back phone #: [phone]  Reason for Call: Wanted to change the pharmacy where his prescriptions are sent to.  He provided [facility] [phone]). Please send over Olmesartan and Testosterone injection

## 2021-12-29 ENCOUNTER — Other Ambulatory Visit (HOSPITAL_BASED_OUTPATIENT_CLINIC_OR_DEPARTMENT_OTHER): Payer: Self-pay

## 2021-12-30 MED ORDER — TESTOSTERONE CYPIONATE 200 MG/ML IM SOLN: 200.0000 mg | INTRAMUSCULAR | 1 refills | Status: DC

## 2021-12-30 MED ORDER — BD DISP NEEDLES 18G X 1-1/2" MISC: 1.0000 | Freq: Every day | 0 refills | Status: DC

## 2021-12-30 MED ORDER — SYRINGE 30 ML MISC: 1.0000 | 0 refills | Status: DC

## 2021-12-30 MED ORDER — OLMESARTAN MEDOXOMIL 20 MG PO TABS: 20.0000 mg | ORAL_TABLET | Freq: Every day | ORAL | 1 refills | Status: DC

## 2021-12-30 MED ORDER — BD DISP NEEDLES 22G X 1-1/2" MISC: 1.0000 | 0 refills | Status: DC

## 2021-12-31 ENCOUNTER — Telehealth: Payer: Self-pay | Admitting: Family Medicine

## 2021-12-31 MED ORDER — TESTOSTERONE CYPIONATE 200 MG/ML IM SOLN: 200.0000 mg | INTRAMUSCULAR | 1 refills | Status: DC

## 2021-12-31 NOTE — Addendum Note (Signed)
Addended by: Andrez Grime on: 12/31/2021 07:59 AM   Modules accepted: Orders

## 2021-12-31 NOTE — Telephone Encounter (Signed)
Pt is saying his testosterone cypionate (DEPOTESTOSTERONE CYPIONATE) 200 MG/ML injection [[id number]]  is needing a PA to be filled for  [facility]) [place], [place] - [address]  [address], [place] [address]  Phone:  [phone]  Fax:  [phone]  DEA #:  [id number]. He has this # to help with the PA [phone].  He is also wanting a script for tadalafil (CIALIS) 20 MG tablet [[id number]].  Pharmacy  [facility]) [place], [place] - [address]  [address] [place] [address]  Phone:  [phone]  Fax:  [phone]  DEA #:  [id number]

## 2022-01-03 ENCOUNTER — Telehealth: Payer: Self-pay | Admitting: Family Medicine

## 2022-01-03 NOTE — Telephone Encounter (Signed)
Caller Name: Pharmacy Call back phone #: [phone]  Reason for Call: Please call to answer questions to clarify prescription sent in for injection. Can not fill 30CC syringe

## 2022-01-04 NOTE — Telephone Encounter (Signed)
Please call back to clarify to pharmacy. Is the syringe supposed to by 3cc? They can not fill until prescription is corrected

## 2022-01-05 ENCOUNTER — Telehealth: Payer: Self-pay | Admitting: Family Medicine

## 2022-01-05 NOTE — Telephone Encounter (Signed)
Called and clarified syringes

## 2022-01-05 NOTE — Telephone Encounter (Signed)
[facility] rep [phone]   Pt questioned them about the amount of disp needles. The script was written for 3 a day. He uses 6 a day. Can you call and change the amount to 6?

## 2022-01-06 NOTE — Telephone Encounter (Signed)
Duplicate message Testosterone approved patient aware that Cialis was discontinued.

## 2022-01-07 ENCOUNTER — Telehealth: Payer: Self-pay | Admitting: Family Medicine

## 2022-01-07 NOTE — Telephone Encounter (Signed)
Pt said the amount of supplies is still wrong and the pharmacy won't fill it.

## 2022-01-07 NOTE — Telephone Encounter (Signed)
Called pharmacy had needles changed

## 2022-01-07 NOTE — Telephone Encounter (Signed)
Spoke with pharmacy had changed to 6 needles

## 2022-01-21 ENCOUNTER — Ambulatory Visit (INDEPENDENT_AMBULATORY_CARE_PROVIDER_SITE_OTHER): Payer: No Typology Code available for payment source

## 2022-01-21 ENCOUNTER — Encounter: Payer: Self-pay | Admitting: Family Medicine

## 2022-01-21 DIAGNOSIS — Z23 Encounter for immunization: Secondary | ICD-10-CM | POA: Diagnosis not present

## 2022-01-21 NOTE — Progress Notes (Signed)
Per orders of DR. KREMER, pt is here for INFLUENZA VACCINE. pt received VACCINE in LEFT DELTOID at 09:02 AM. Given by Marcy Salvo . Pt tolerated VACCINE well.

## 2022-01-31 ENCOUNTER — Encounter: Payer: Self-pay | Admitting: Family Medicine

## 2022-01-31 ENCOUNTER — Ambulatory Visit: Payer: Self-pay

## 2022-01-31 ENCOUNTER — Ambulatory Visit: Payer: No Typology Code available for payment source | Admitting: Family Medicine

## 2022-01-31 VITALS — BP 152/78 | Ht 72.0 in | Wt 249.0 lb

## 2022-01-31 DIAGNOSIS — G5601 Carpal tunnel syndrome, right upper limb: Secondary | ICD-10-CM | POA: Diagnosis not present

## 2022-01-31 MED ORDER — BETAMETHASONE SOD PHOS & ACET 6 (3-3) MG/ML IJ SUSP
6.0000 mg | Freq: Once | INTRAMUSCULAR | Status: AC
  Administered 2022-01-31: 6 mg via INTRA_ARTICULAR

## 2022-01-31 NOTE — Assessment & Plan Note (Signed)
Acute exacerbation of his underlying carpal tunnel syndrome.  Did not tolerate a brace. -Counseled on home exercise therapy and supportive care. -Injection today. -Referral to physical therapy. -Consider nerve study.

## 2022-01-31 NOTE — Progress Notes (Signed)
  Randall Collins - 56 y.o. male MRN [id number]  Date of birth: 05/26/1965  SUBJECTIVE:  Including CC & ROS.  No chief complaint on file.   Randall Collins is a 56 y.o. male that is presenting with worsening right carpal tunnel syndrome.  He has done well with his previous injection.  Symptoms have slowly returned and are intermittent in nature.    Review of Systems See HPI   HISTORY: Past Medical, Surgical, Social, and Family History Reviewed & Updated per EMR.   Pertinent Historical Findings include:  Past Medical History:  Diagnosis Date   ADD (attention deficit disorder)    ED (erectile dysfunction)    Mixed hyperlipidemia     History reviewed. No pertinent surgical history.   PHYSICAL EXAM:  VS: BP (!) 152/78 (BP Location: Left Arm, Patient Position: Sitting)   Ht 6' (1.829 m)   Wt 249 lb (112.9 kg)   BMI 33.77 kg/m  Physical Exam Gen: NAD, alert, cooperative with exam, well-appearing MSK:  Neurovascularly intact    Limited ultrasound: Right wrist:  Median nerve in the carpal tunnel measures 0.10 cm.  Summary: Findings consistent with carpal tunnel syndrome.  Ultrasound and interpretation by Clearance Coots, MD   Aspiration/Injection Procedure Note Randall Collins [facility] 10-13-1965  Procedure: Injection Indications: Right carpal tunnel syndrome  Procedure Details Consent: Risks of procedure as well as the alternatives and risks of each were explained to the (patient/caregiver).  Consent for procedure obtained. Time Out: Verified patient identification, verified procedure, site/side was marked, verified correct patient position, special equipment/implants available, medications/allergies/relevent history reviewed, required imaging and test results available.  Performed.  The area was cleaned with iodine and alcohol swabs.    The right carpal tunnel was injected using 2 cc of normal saline, 2 cc of D5W,1 cc's of 6 mg betamethasone and 2 cc's of 0.25%  bupivacaine with a 25 1 1/2" needle.  Ultrasound was used. Images were obtained in long views showing the injection.     A sterile dressing was applied.  Patient did tolerate procedure well.    ASSESSMENT & PLAN:   Carpal tunnel syndrome of right wrist Acute exacerbation of his underlying carpal tunnel syndrome.  Did not tolerate a brace. -Counseled on home exercise therapy and supportive care. -Injection today. -Referral to physical therapy. -Consider nerve study.

## 2022-01-31 NOTE — Patient Instructions (Signed)
Good to see you Please try the exercises  I have made a referral to physical therapy  Please send me a message in MyChart with any questions or updates.  Please see me back in 4-6 weeks.   --Dr. Raeford Razor

## 2022-02-02 ENCOUNTER — Encounter: Payer: Self-pay | Admitting: Family Medicine

## 2022-02-02 ENCOUNTER — Ambulatory Visit (INDEPENDENT_AMBULATORY_CARE_PROVIDER_SITE_OTHER): Payer: No Typology Code available for payment source | Admitting: Family Medicine

## 2022-02-02 VITALS — BP 136/82 | HR 69 | Temp 97.4°F | Ht 72.0 in | Wt 246.4 lb

## 2022-02-02 DIAGNOSIS — R41840 Attention and concentration deficit: Secondary | ICD-10-CM

## 2022-02-02 DIAGNOSIS — I10 Essential (primary) hypertension: Secondary | ICD-10-CM

## 2022-02-02 DIAGNOSIS — F32A Depression, unspecified: Secondary | ICD-10-CM

## 2022-02-02 DIAGNOSIS — E291 Testicular hypofunction: Secondary | ICD-10-CM

## 2022-02-02 DIAGNOSIS — L739 Follicular disorder, unspecified: Secondary | ICD-10-CM

## 2022-02-02 DIAGNOSIS — N529 Male erectile dysfunction, unspecified: Secondary | ICD-10-CM

## 2022-02-02 MED ORDER — TADALAFIL 20 MG PO TABS: 20.0000 mg | ORAL_TABLET | Freq: Every day | ORAL | 0 refills | Status: DC | PRN

## 2022-02-02 MED ORDER — MUPIROCIN 2 % EX OINT: 1.0000 | TOPICAL_OINTMENT | Freq: Two times a day (BID) | CUTANEOUS | 0 refills | Status: DC

## 2022-02-02 NOTE — Addendum Note (Signed)
Addended by: Lynnea Ferrier on: 02/02/2022 11:35 AM   Modules accepted: Orders

## 2022-02-02 NOTE — Progress Notes (Signed)
Established Patient Office Visit  Subjective   Patient ID: Randall Collins, male    DOB: November 23, 1965  Age: 56 y.o. MRN: [id number]  Chief Complaint  Patient presents with   Follow-up    3 month follow up, pt would like testosterone levels checked with labs. Patient fasting.     HPI follow-up of androgen deficiency, depression with attention deficit, hypertension and folliculitis.  Uses muciporin for folliculitis with good effect.  Wellbutrin is definitely helped his mood and focus.  He would like to continue it.  He discontinued olmesartan a few weeks ago because he was experiencing some lightheadedness when standing.  It did not helped his symptoms.  Blood pressure while on olmesartan was in the low 120s over 60s to 70s.  Last testosterone injection was 2 days ago on Monday.    Review of Systems  Constitutional: Negative.   HENT: Negative.    Eyes:  Negative for blurred vision, discharge and redness.  Respiratory: Negative.    Cardiovascular: Negative.   Gastrointestinal:  Negative for abdominal pain.  Genitourinary: Negative.   Musculoskeletal: Negative.  Negative for myalgias.  Skin:  Negative for rash.  Neurological:  Negative for tingling, loss of consciousness and weakness.  Endo/Heme/Allergies:  Negative for polydipsia.      Objective:     BP 136/82 (BP Location: Left Arm, Patient Position: Sitting, Cuff Size: Large)   Pulse 69   Temp (!) 97.4 F (36.3 C) (Temporal)   Ht 6' (1.829 m)   Wt 246 lb 6.4 oz (111.8 kg)   SpO2 97%   BMI 33.42 kg/m  BP Readings from Last 3 Encounters:  02/02/22 136/82  01/31/22 (!) 152/78  10/29/21 (!) 138/102   Wt Readings from Last 3 Encounters:  02/02/22 246 lb 6.4 oz (111.8 kg)  01/31/22 249 lb (112.9 kg)  10/29/21 249 lb (112.9 kg)      Physical Exam Constitutional:      General: He is not in acute distress.    Appearance: Normal appearance. He is not ill-appearing, toxic-appearing or diaphoretic.  HENT:     Head:  Normocephalic and atraumatic.     Right Ear: External ear normal.     Left Ear: External ear normal.  Eyes:     General: No scleral icterus.       Right eye: No discharge.        Left eye: No discharge.     Extraocular Movements: Extraocular movements intact.     Conjunctiva/sclera: Conjunctivae normal.  Cardiovascular:     Rate and Rhythm: Normal rate and regular rhythm.  Pulmonary:     Effort: Pulmonary effort is normal. No respiratory distress.     Breath sounds: Normal breath sounds.  Musculoskeletal:     Cervical back: No rigidity or tenderness.  Skin:    General: Skin is warm and dry.  Neurological:     Mental Status: He is alert and oriented to person, place, and time.  Psychiatric:        Mood and Affect: Mood normal.        Behavior: Behavior normal.      No results found for any visits on 02/02/22.    The ASCVD Risk score (Arnett DK, et al., 2019) failed to calculate for the following reasons:   The valid total cholesterol range is 130 to 320 mg/dL    Assessment & Plan:   Problem List Items Addressed This Visit       Cardiovascular and Mediastinum  Essential hypertension   Relevant Medications   tadalafil (CIALIS) 20 MG tablet   Other Relevant Orders   Basic metabolic panel   CBC     Endocrine   Androgen deficiency - Primary   Relevant Orders   Testosterone Total,Free,Bio, Males-([facility])   CBC     Other   Attention deficit   Depression   Other Visit Diagnoses     Erectile dysfunction, unspecified erectile dysfunction type       Relevant Medications   tadalafil (CIALIS) 20 MG tablet   Folliculitis       Relevant Medications   mupirocin ointment (BACTROBAN) 2 %       Return in about 6 months (around 08/04/2022), or Return on Monday for blood draw..  We will restart olmesartan directly.  Return on Monday for check of testosterone levels.  Advised patient to have the seasonal COVID-vaccine.  Mliss Sax, MD

## 2022-02-07 ENCOUNTER — Other Ambulatory Visit: Payer: No Typology Code available for payment source

## 2022-02-09 ENCOUNTER — Telehealth: Payer: Self-pay | Admitting: Family Medicine

## 2022-02-09 NOTE — Telephone Encounter (Signed)
Caller Name: [facility] Call back phone #: [phone]  Reason for Call: The prescription recently called into [facility] on 10/11 was sent in for a 1 month supply. Pt requested a 3 month, can we please change this

## 2022-02-11 ENCOUNTER — Other Ambulatory Visit: Payer: No Typology Code available for payment source

## 2022-02-14 ENCOUNTER — Other Ambulatory Visit (INDEPENDENT_AMBULATORY_CARE_PROVIDER_SITE_OTHER): Payer: No Typology Code available for payment source

## 2022-02-14 DIAGNOSIS — E291 Testicular hypofunction: Secondary | ICD-10-CM

## 2022-02-14 DIAGNOSIS — I10 Essential (primary) hypertension: Secondary | ICD-10-CM | POA: Diagnosis not present

## 2022-02-15 LAB — BASIC METABOLIC PANEL
BUN: 13 mg/dL (ref 6–23)
CO2: 27 mEq/L (ref 19–32)
Calcium: 9.2 mg/dL (ref 8.4–10.5)
Chloride: 104 mEq/L (ref 96–112)
Creatinine, Ser: 0.94 mg/dL (ref 0.40–1.50)
GFR: 90.59 mL/min (ref >60.00)
Glucose, Bld: 91 mg/dL (ref 70–99)
Potassium: 4.1 mEq/L (ref 3.5–5.1)
Sodium: 139 mEq/L (ref 135–145)

## 2022-02-15 LAB — CBC
HCT: 48.7 % (ref 39.0–52.0)
Hemoglobin: 16.8 g/dL (ref 13.0–17.0)
MCHC: 34.6 g/dL (ref 30.0–36.0)
MCV: 87.1 fl (ref 78.0–100.0)
Platelets: 210 10*3/uL (ref 150.0–400.0)
RBC: 5.59 Mil/uL (ref 4.22–5.81)
RDW: 14.1 % (ref 11.5–15.5)
WBC: 6.8 10*3/uL (ref 4.0–10.5)

## 2022-02-15 NOTE — Telephone Encounter (Signed)
done

## 2022-02-21 LAB — TESTOSTERONE TOTAL,FREE,BIO, MALES

## 2022-02-21 LAB — TIQ-MISC

## 2022-02-23 ENCOUNTER — Telehealth: Payer: Self-pay | Admitting: Family Medicine

## 2022-02-23 NOTE — Telephone Encounter (Signed)
Pt calling about why his testosterone and other labs were cancelled. He thought they'd already been drawn.  Phone # [phone]

## 2022-02-23 NOTE — Telephone Encounter (Signed)
Patient scheduled to come in 02/24/22 at 10am does patient need to keep this appointment or hold until you hear from the lab?

## 2022-02-23 NOTE — Telephone Encounter (Signed)
They needed to know whether it as serum or plasma. We will call and fax the info, so test will be resulted

## 2022-02-24 ENCOUNTER — Other Ambulatory Visit: Payer: No Typology Code available for payment source

## 2022-02-24 NOTE — Telephone Encounter (Signed)
Patient aware of message above, appointment canceled

## 2022-03-16 ENCOUNTER — Telehealth: Payer: Self-pay | Admitting: *Deleted

## 2022-03-16 ENCOUNTER — Ambulatory Visit
Admission: RE | Admit: 2022-03-16 | Discharge: 2022-03-16 | Disposition: A | Discharge status: Home / Self Care | Payer: No Typology Code available for payment source | Source: Ambulatory Visit | Attending: Emergency Medicine | Admitting: Emergency Medicine

## 2022-03-16 ENCOUNTER — Telehealth: Payer: Self-pay | Admitting: Family Medicine

## 2022-03-16 VITALS — BP 150/81 | HR 75 | Temp 98.2°F | Resp 18

## 2022-03-16 DIAGNOSIS — E291 Testicular hypofunction: Secondary | ICD-10-CM

## 2022-03-16 DIAGNOSIS — L02211 Cutaneous abscess of abdominal wall: Secondary | ICD-10-CM

## 2022-03-16 MED ORDER — SULFAMETHOXAZOLE-TRIMETHOPRIM 800-160 MG PO TABS: 2.0000 | ORAL_TABLET | Freq: Two times a day (BID) | ORAL | 0 refills | Status: AC

## 2022-03-16 MED ORDER — SULFAMETHOXAZOLE-TRIMETHOPRIM 800-160 MG PO TABS: 2.0000 | ORAL_TABLET | Freq: Two times a day (BID) | ORAL | 0 refills | Status: DC

## 2022-03-16 MED ORDER — SULFAMETHOXAZOLE-TRIMETHOPRIM 800-160 MG PO TABS: 1.0000 | ORAL_TABLET | Freq: Two times a day (BID) | ORAL | 0 refills | Status: DC

## 2022-03-16 NOTE — Telephone Encounter (Signed)
Pt is wanting a cb concerning his most recent lab results. Please advise pt at   [phone] [facility])

## 2022-03-16 NOTE — Telephone Encounter (Signed)
Patient calling to go over lab results from October patient have viewed online would like to discuss with Provider. Please advise.

## 2022-03-16 NOTE — Discharge Instructions (Signed)
Please read the enclosed information about skin abscesses and how to care for them at home.  I sent a prescription for Bactrim to your pharmacy.  Please take 2 tablets twice daily for the next 5 days.  Please be sure you apply warm, moist heat to the affected area several times a day to promote circulation and help the abscess drain, if it needs to.  Thank you for visiting urgent care today.
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
## 2022-03-16 NOTE — ED Triage Notes (Signed)
Presents with c/o abscess on mid abdomen. Reports pain and redness. Onset Monday. Reports location is warm. Has not taken OTC medicine for symptoms.  Visible redness to area.

## 2022-03-16 NOTE — Telephone Encounter (Signed)
Spoke with patient went over labs, patient states that he was told that he would not have to return for repeat testosterone level on 02/24/22 and appointment was canceled. Due to lab not running levels as previously mentioned patient was asked to schedule another appointment for testosterone. Per [place] there would not be a charge for this due to patient not at fault for labs not being resulted.

## 2022-03-25 ENCOUNTER — Encounter: Payer: Self-pay | Admitting: Family Medicine

## 2022-03-25 ENCOUNTER — Ambulatory Visit (INDEPENDENT_AMBULATORY_CARE_PROVIDER_SITE_OTHER): Payer: No Typology Code available for payment source | Admitting: Family Medicine

## 2022-03-25 VITALS — BP 138/78 | HR 79 | Temp 97.8°F | Ht 72.0 in | Wt 246.4 lb

## 2022-03-25 DIAGNOSIS — L0291 Cutaneous abscess, unspecified: Secondary | ICD-10-CM | POA: Insufficient documentation

## 2022-03-25 MED ORDER — SULFAMETHOXAZOLE-TRIMETHOPRIM 800-160 MG PO TABS: 1.0000 | ORAL_TABLET | Freq: Two times a day (BID) | ORAL | 0 refills | Status: AC

## 2022-03-25 NOTE — Progress Notes (Signed)
Established Patient Office Visit   Subjective:  Patient ID: Randall Collins, male    DOB: [birth year]  Age: 56 y.o. MRN: [id number]  Chief Complaint  Patient presents with   Follow-up    Follow up boil on stomach, little tender to touch had 5 days of antibiotics still present.     HPI Encounter Diagnoses  Name Primary?   Abscess Yes   10-day history of a tender swelling on his abdomen.  Seen in urgent care 8 days ago and prescribed Bactrim.  It has not resolved.  History of folliculitis.  History of prior abscesses.   Review of Systems  Constitutional: Negative.   HENT: Negative.    Eyes:  Negative for blurred vision, discharge and redness.  Respiratory: Negative.    Cardiovascular: Negative.   Gastrointestinal:  Negative for abdominal pain.  Genitourinary: Negative.   Musculoskeletal: Negative.  Negative for myalgias.  Skin:  Negative for rash.  Neurological:  Negative for tingling, loss of consciousness and weakness.  Endo/Heme/Allergies:  Negative for polydipsia.     Current Outpatient Medications:    buPROPion (WELLBUTRIN XL) 150 MG 24 hr tablet, Take 1 tablet by mouth daily, Disp: 90 tablet, Rfl: 1   mupirocin ointment (BACTROBAN) 2 %, Apply 1 Application topically 2 (two) times daily. As needed for outbreak of folliculitis., Disp: 22 g, Rfl: 0   NEEDLE, DISP, 18 G (BD DISP NEEDLES) 18G X 1-1/2" MISC, 1 Box by Does not apply route daily., Disp: 3 each, Rfl: 0   NEEDLE, DISP, 22 G (BD DISP NEEDLES) 22G X 1-1/2" MISC, 1 Box by Does not apply route as directed., Disp: 3 each, Rfl: 0   olmesartan (BENICAR) 20 MG tablet, Take 1 tablet (20 mg total) by mouth daily., Disp: 90 tablet, Rfl: 1   sulfamethoxazole-trimethoprim (BACTRIM DS) 800-160 MG tablet, Take 1 tablet by mouth 2 (two) times daily for 10 days., Disp: 20 tablet, Rfl: 0   tadalafil (CIALIS) 20 MG tablet, Take 1 tablet (20 mg total) by mouth daily as needed for erectile dysfunction., Disp: 90 tablet, Rfl: 0    testosterone cypionate (DEPOTESTOSTERONE CYPIONATE) 200 MG/ML injection, Inject 1 mL (200 mg total) into the muscle every 14 (fourteen) days., Disp: 10 mL, Rfl: 1   Objective:     BP 138/78 (BP Location: Right Arm, Patient Position: Sitting, Cuff Size: Large)   Pulse 79   Temp 97.8 F (36.6 C) (Temporal)   Ht 6' (1.829 m)   Wt 246 lb 6.4 oz (111.8 kg)   SpO2 96%   BMI 33.42 kg/m    Physical Exam Constitutional:      General: He is not in acute distress.    Appearance: Normal appearance. He is not ill-appearing, toxic-appearing or diaphoretic.  HENT:     Head: Normocephalic and atraumatic.     Right Ear: External ear normal.     Left Ear: External ear normal.  Eyes:     General: No scleral icterus.       Right eye: No discharge.        Left eye: No discharge.     Extraocular Movements: Extraocular movements intact.     Conjunctiva/sclera: Conjunctivae normal.  Pulmonary:     Effort: Pulmonary effort is normal. No respiratory distress.  Skin:    General: Skin is warm and dry.       Neurological:     Mental Status: He is alert and oriented to person, place, and time.  Psychiatric:  Mood and Affect: Mood normal.        Behavior: Behavior normal.   Verbal consent was obtained from the patient.  Explained the procedure to him. Incision and Drainage Procedure Note  Pre-operative Diagnosis: Abscess of abdominal wall  Post-operative Diagnosis: normal  Indications: Pain and swelling.  Anesthesia: 1% lidocaine with epinephrine used 8 cc for circumferential block.  Procedure Details  The procedure, risks and complications have been discussed in detail (including, but not limited to airway compromise, infection, bleeding) with the patient, and the patient has signed consent to the procedure.  The skin was sterilely prepped and draped over the affected area in the usual fashion. After adequate local anesthesia, I&D with a #11 blade was performed on the midline  abdominal wall 4 cm above the umbilicus. Purulent drainage: present The patient was observed until stable.  Findings: Abscess  EBL: 0 cc's  Drains: Quarter-inch drain take was inserted into the wound cavity.  Condition: Tolerated procedure well   Complications: none.    No results found for any visits on 03/25/22.  Culture was sent.  The ASCVD Risk score (Arnett DK, et al., 2019) failed to calculate for the following reasons:   The valid total cholesterol range is 130 to 320 mg/dL    Assessment & Plan:   Abscess -     Sulfamethoxazole-Trimethoprim; Take 1 tablet by mouth 2 (two) times daily for 10 days.  Dispense: 20 tablet; Refill: 0 -     WOUND CULTURE  Wound care was discussed.  Expect drainage.  Will change dressing as needed and after showers.  Follow-up on Monday for repacking.  Return return on monday.Mliss Sax, MD

## 2022-03-28 ENCOUNTER — Ambulatory Visit (INDEPENDENT_AMBULATORY_CARE_PROVIDER_SITE_OTHER): Payer: No Typology Code available for payment source | Admitting: Family Medicine

## 2022-03-28 ENCOUNTER — Encounter: Payer: Self-pay | Admitting: Family Medicine

## 2022-03-28 VITALS — BP 120/68 | HR 81 | Temp 98.0°F | Ht 72.0 in | Wt 250.4 lb

## 2022-03-28 DIAGNOSIS — L0291 Cutaneous abscess, unspecified: Secondary | ICD-10-CM

## 2022-03-28 LAB — WOUND CULTURE
MICRO NUMBER:: 14258147
SPECIMEN QUALITY:: ADEQUATE

## 2022-03-28 NOTE — Progress Notes (Signed)
Established Patient Office Visit   Subjective:  Patient ID: Randall Collins, male    DOB: Mar 08, 1966  Age: 56 y.o. MRN: [id number]  Chief Complaint  Patient presents with   Follow-up    Follow up on abscess, no concerns.     HPI Encounter Diagnoses  Name Primary?   Abscess Yes   Wound is doing well.  Is currently covered with a Band-Aid and not trying to much.   Review of Systems  Constitutional: Negative.   HENT: Negative.    Eyes:  Negative for blurred vision, discharge and redness.  Respiratory: Negative.    Cardiovascular: Negative.   Gastrointestinal:  Negative for abdominal pain.  Genitourinary: Negative.   Musculoskeletal: Negative.  Negative for myalgias.  Skin:  Negative for itching and rash.  Neurological:  Negative for tingling, loss of consciousness and weakness.  Endo/Heme/Allergies:  Negative for polydipsia.     Current Outpatient Medications:    buPROPion (WELLBUTRIN XL) 150 MG 24 hr tablet, Take 1 tablet by mouth daily, Disp: 90 tablet, Rfl: 1   mupirocin ointment (BACTROBAN) 2 %, Apply 1 Application topically 2 (two) times daily. As needed for outbreak of folliculitis., Disp: 22 g, Rfl: 0   NEEDLE, DISP, 18 G (BD DISP NEEDLES) 18G X 1-1/2" MISC, 1 Box by Does not apply route daily., Disp: 3 each, Rfl: 0   NEEDLE, DISP, 22 G (BD DISP NEEDLES) 22G X 1-1/2" MISC, 1 Box by Does not apply route as directed., Disp: 3 each, Rfl: 0   olmesartan (BENICAR) 20 MG tablet, Take 1 tablet (20 mg total) by mouth daily., Disp: 90 tablet, Rfl: 1   sulfamethoxazole-trimethoprim (BACTRIM DS) 800-160 MG tablet, Take 1 tablet by mouth 2 (two) times daily for 10 days., Disp: 20 tablet, Rfl: 0   tadalafil (CIALIS) 20 MG tablet, Take 1 tablet (20 mg total) by mouth daily as needed for erectile dysfunction., Disp: 90 tablet, Rfl: 0   testosterone cypionate (DEPOTESTOSTERONE CYPIONATE) 200 MG/ML injection, Inject 1 mL (200 mg total) into the muscle every 14 (fourteen) days., Disp:  10 mL, Rfl: 1   Objective:     BP 120/68 (BP Location: Right Arm, Patient Position: Sitting, Cuff Size: Large)   Pulse 81   Temp 98 F (36.7 C) (Temporal)   Ht 6' (1.829 m)   Wt 250 lb 6.4 oz (113.6 kg)   SpO2 96%   BMI 33.96 kg/m    Physical Exam Constitutional:      General: He is not in acute distress.    Appearance: Normal appearance. He is not ill-appearing, toxic-appearing or diaphoretic.  HENT:     Head: Normocephalic and atraumatic.     Right Ear: External ear normal.     Left Ear: External ear normal.  Eyes:     General: No scleral icterus.       Right eye: No discharge.        Left eye: No discharge.     Extraocular Movements: Extraocular movements intact.     Conjunctiva/sclera: Conjunctivae normal.  Pulmonary:     Effort: Pulmonary effort is normal. No respiratory distress.  Skin:    General: Skin is warm and dry.       Neurological:     Mental Status: He is alert and oriented to person, place, and time.  Psychiatric:        Mood and Affect: Mood normal.        Behavior: Behavior normal.      No  results found for any visits on 03/28/22.    The ASCVD Risk score (Arnett DK, et al., 2019) failed to calculate for the following reasons:   The valid total cholesterol range is 130 to 320 mg/dL    Assessment & Plan:   Abscess    Return in about 2 days (around 03/30/2022).  Will return on Wednesday for packing removal.  Not anticipating repacking at that time.  Mliss Sax, MD

## 2022-03-30 ENCOUNTER — Encounter: Payer: Self-pay | Admitting: Family Medicine

## 2022-03-30 ENCOUNTER — Ambulatory Visit (INDEPENDENT_AMBULATORY_CARE_PROVIDER_SITE_OTHER): Payer: No Typology Code available for payment source | Admitting: Family Medicine

## 2022-03-30 VITALS — BP 120/68 | HR 81 | Temp 97.1°F | Ht 72.0 in | Wt 250.0 lb

## 2022-03-30 DIAGNOSIS — L0291 Cutaneous abscess, unspecified: Secondary | ICD-10-CM

## 2022-03-30 NOTE — Progress Notes (Signed)
Established Patient Office Visit   Subjective:  Patient ID: Randall Collins, male    DOB: 1965/12/07  Age: 56 y.o. MRN: [id number]  Chief Complaint  Patient presents with   Follow-up    Follow up on abscess, no concerns.     HPI Encounter Diagnoses  Name Primary?   Abscess Yes   Doing well.  Drainage is stopped.   Review of Systems  Constitutional: Negative.   HENT: Negative.    Eyes:  Negative for blurred vision, discharge and redness.  Respiratory: Negative.    Cardiovascular: Negative.   Gastrointestinal:  Negative for abdominal pain.  Genitourinary: Negative.   Musculoskeletal: Negative.  Negative for myalgias.  Skin:  Negative for rash.  Neurological:  Negative for tingling, loss of consciousness and weakness.  Endo/Heme/Allergies:  Negative for polydipsia.     Current Outpatient Medications:    buPROPion (WELLBUTRIN XL) 150 MG 24 hr tablet, Take 1 tablet by mouth daily, Disp: 90 tablet, Rfl: 1   mupirocin ointment (BACTROBAN) 2 %, Apply 1 Application topically 2 (two) times daily. As needed for outbreak of folliculitis., Disp: 22 g, Rfl: 0   NEEDLE, DISP, 18 G (BD DISP NEEDLES) 18G X 1-1/2" MISC, 1 Box by Does not apply route daily., Disp: 3 each, Rfl: 0   NEEDLE, DISP, 22 G (BD DISP NEEDLES) 22G X 1-1/2" MISC, 1 Box by Does not apply route as directed., Disp: 3 each, Rfl: 0   olmesartan (BENICAR) 20 MG tablet, Take 1 tablet (20 mg total) by mouth daily., Disp: 90 tablet, Rfl: 1   sulfamethoxazole-trimethoprim (BACTRIM DS) 800-160 MG tablet, Take 1 tablet by mouth 2 (two) times daily for 10 days., Disp: 20 tablet, Rfl: 0   tadalafil (CIALIS) 20 MG tablet, Take 1 tablet (20 mg total) by mouth daily as needed for erectile dysfunction., Disp: 90 tablet, Rfl: 0   testosterone cypionate (DEPOTESTOSTERONE CYPIONATE) 200 MG/ML injection, Inject 1 mL (200 mg total) into the muscle every 14 (fourteen) days., Disp: 10 mL, Rfl: 1   Objective:     BP 120/68 (BP Location:  Right Arm, Patient Position: Sitting, Cuff Size: Large)   Pulse 81   Temp (!) 97.1 F (36.2 C) (Temporal)   Ht 6' (1.829 m)   Wt 250 lb (113.4 kg)   SpO2 96%   BMI 33.91 kg/m    Physical Exam Constitutional:      General: He is not in acute distress.    Appearance: Normal appearance. He is not ill-appearing, toxic-appearing or diaphoretic.  HENT:     Head: Normocephalic and atraumatic.     Right Ear: External ear normal.     Left Ear: External ear normal.  Eyes:     General: No scleral icterus.       Right eye: No discharge.        Left eye: No discharge.     Extraocular Movements: Extraocular movements intact.     Conjunctiva/sclera: Conjunctivae normal.  Pulmonary:     Effort: Pulmonary effort is normal. No respiratory distress.  Skin:    General: Skin is warm and dry.       Neurological:     Mental Status: He is alert and oriented to person, place, and time.  Psychiatric:        Mood and Affect: Mood normal.        Behavior: Behavior normal.      No results found for any visits on 03/30/22.    The ASCVD Risk  score (Arnett DK, et al., 2019) failed to calculate for the following reasons:   The valid total cholesterol range is 130 to 320 mg/dL    Assessment & Plan:   Abscess    Return if symptoms worsen or fail to improve.  Complete course of Septra.  We will do Hibiclens baths times two 1 week apart.  Mliss Sax, MD

## 2022-04-16 ENCOUNTER — Other Ambulatory Visit: Payer: Self-pay | Admitting: Family Medicine

## 2022-04-16 DIAGNOSIS — N529 Male erectile dysfunction, unspecified: Secondary | ICD-10-CM

## 2022-08-08 ENCOUNTER — Encounter: Payer: Self-pay | Admitting: *Deleted

## 2022-10-13 ENCOUNTER — Other Ambulatory Visit: Payer: Self-pay

## 2022-10-13 ENCOUNTER — Ambulatory Visit (INDEPENDENT_AMBULATORY_CARE_PROVIDER_SITE_OTHER): Payer: 59 | Admitting: Internal Medicine

## 2022-10-13 ENCOUNTER — Encounter: Payer: Self-pay | Admitting: Internal Medicine

## 2022-10-13 VITALS — BP 126/82 | HR 78 | Temp 97.7°F | Ht 71.0 in | Wt 246.4 lb

## 2022-10-13 DIAGNOSIS — L739 Follicular disorder, unspecified: Secondary | ICD-10-CM | POA: Diagnosis not present

## 2022-10-13 DIAGNOSIS — L0291 Cutaneous abscess, unspecified: Secondary | ICD-10-CM

## 2022-10-13 DIAGNOSIS — I10 Essential (primary) hypertension: Secondary | ICD-10-CM

## 2022-10-13 DIAGNOSIS — N529 Male erectile dysfunction, unspecified: Secondary | ICD-10-CM

## 2022-10-13 MED ORDER — MUPIROCIN 2 % EX OINT
1.0000 | TOPICAL_OINTMENT | Freq: Two times a day (BID) | CUTANEOUS | 0 refills | Status: DC
Start: 2022-10-13 — End: 2023-10-04

## 2022-10-13 MED ORDER — SULFAMETHOXAZOLE-TRIMETHOPRIM 800-160 MG PO TABS
1.0000 | ORAL_TABLET | Freq: Two times a day (BID) | ORAL | 0 refills | Status: AC
Start: 2022-10-13 — End: 2022-10-20
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
## 2022-10-13 NOTE — Patient Instructions (Signed)
Warm compresses to area  Take antibiotic as prescribed

## 2022-10-17 MED ORDER — OLMESARTAN MEDOXOMIL 20 MG PO TABS
20.0000 mg | ORAL_TABLET | Freq: Every day | ORAL | 1 refills | Status: DC
Start: 2022-10-17 — End: 2023-04-20

## 2022-10-17 MED ORDER — TADALAFIL 20 MG PO TABS: ORAL_TABLET | ORAL | 0 refills | Status: DC

## 2022-12-06 ENCOUNTER — Encounter: Payer: Self-pay | Admitting: Family Medicine

## 2022-12-06 ENCOUNTER — Ambulatory Visit (INDEPENDENT_AMBULATORY_CARE_PROVIDER_SITE_OTHER): Payer: 59 | Admitting: Family Medicine

## 2022-12-06 VITALS — BP 122/74 | HR 76 | Temp 97.9°F | Ht 71.0 in | Wt 247.8 lb

## 2022-12-06 DIAGNOSIS — L255 Unspecified contact dermatitis due to plants, except food: Secondary | ICD-10-CM | POA: Diagnosis not present

## 2022-12-06 MED ORDER — METHYLPREDNISOLONE ACETATE 80 MG/ML IJ SUSP
80.0000 mg | Freq: Once | INTRAMUSCULAR | Status: DC
Start: 2022-12-06 — End: 2023-04-20

## 2022-12-06 NOTE — Progress Notes (Signed)
Established Patient Office Visit   Subjective:  Patient ID: Randall Collins, male    DOB: 1965/05/28  Age: 57 y.o. MRN: [id number]  Chief Complaint  Patient presents with   Poison Ivy    Posion ivy exposure x 4 days affected pt left arm and the back of both legs.     Poison Ivy Pertinent negatives include no fever.   Encounter Diagnoses  Name Primary?   Rhus dermatitis Yes   6-day history of pruritic rash on his arms and legs after working in his yard on the hedge row and trimming vines.   Review of Systems  Constitutional: Negative.  Negative for chills and fever.  HENT: Negative.    Eyes:  Negative for blurred vision, discharge and redness.  Respiratory: Negative.    Cardiovascular: Negative.   Gastrointestinal:  Negative for abdominal pain.  Genitourinary: Negative.   Musculoskeletal: Negative.  Negative for myalgias.  Skin:  Positive for itching and rash.  Neurological:  Negative for tingling, loss of consciousness and weakness.  Endo/Heme/Allergies:  Negative for polydipsia.     Current Outpatient Medications:    buPROPion (WELLBUTRIN XL) 150 MG 24 hr tablet, Take 1 tablet by mouth daily, Disp: 90 tablet, Rfl: 1   mupirocin ointment (BACTROBAN) 2 %, Apply 1 Application topically 2 (two) times daily. As needed for outbreak of folliculitis., Disp: 22 g, Rfl: 0   NEEDLE, DISP, 18 G (BD DISP NEEDLES) 18G X 1-1/2" MISC, 1 Box by Does not apply route daily., Disp: 3 each, Rfl: 0   NEEDLE, DISP, 22 G (BD DISP NEEDLES) 22G X 1-1/2" MISC, 1 Box by Does not apply route as directed., Disp: 3 each, Rfl: 0   olmesartan (BENICAR) 20 MG tablet, Take 1 tablet (20 mg total) by mouth daily., Disp: 90 tablet, Rfl: 1   tadalafil (CIALIS) 20 MG tablet, TAKE 1 TABLET BY MOUTH ONCE A DAY AS NEEDED FOR ERECTILE DYSFUNCTION, Disp: 90 tablet, Rfl: 0   testosterone cypionate (DEPOTESTOSTERONE CYPIONATE) 200 MG/ML injection, Inject 1 mL (200 mg total) into the muscle every 14 (fourteen)  days., Disp: 10 mL, Rfl: 1  Current Facility-Administered Medications:    methylPREDNISolone acetate (DEPO-MEDROL) injection 80 mg, 80 mg, Intramuscular, Once,    Objective:     BP 122/74   Pulse 76   Temp 97.9 F (36.6 C)   Ht 5\' 11"  (1.803 m)   Wt 247 lb 12.8 oz (112.4 kg)   SpO2 95%   BMI 34.56 kg/m    Physical Exam Constitutional:      General: He is not in acute distress.    Appearance: Normal appearance. He is not ill-appearing, toxic-appearing or diaphoretic.  HENT:     Head: Normocephalic and atraumatic.     Right Ear: External ear normal.     Left Ear: External ear normal.  Eyes:     General: No scleral icterus.       Right eye: No discharge.        Left eye: No discharge.     Extraocular Movements: Extraocular movements intact.     Conjunctiva/sclera: Conjunctivae normal.  Pulmonary:     Effort: Pulmonary effort is normal. No respiratory distress.  Skin:    General: Skin is warm and dry.     Findings: Rash present. Rash is macular, papular and vesicular.     Comments: There are copious scattered erythematous papules some of which are in a linear formation on the arms legs and feet.  There  are coalescing patches with vesicles in the popliteal fossa as well as the left forearm.  Neurological:     Mental Status: He is alert and oriented to person, place, and time.  Psychiatric:        Mood and Affect: Mood normal.        Behavior: Behavior normal.      No results found for any visits on 12/06/22.    The ASCVD Risk score (Arnett DK, et al., 2019) failed to calculate for the following reasons:   The valid total cholesterol range is 130 to 320 mg/dL    Assessment & Plan:   Rhus dermatitis -     methylPREDNISolone Acetate    Return if symptoms worsen or fail to improve.  We discussed the natural history of the rash that if it did nothing it would pass in 2 to 3 weeks.  It has been affecting his sleep.  We discussed using oral prednisone versus an  injection.  He understands that injection may take 2 to 3 days to become effective.  Follow-up in 1 week if not improving.  Information was given on rhus dermatitis  Mliss Sax, MD

## 2023-01-07 ENCOUNTER — Other Ambulatory Visit: Payer: Self-pay | Admitting: Family Medicine

## 2023-01-07 DIAGNOSIS — N529 Male erectile dysfunction, unspecified: Secondary | ICD-10-CM

## 2023-02-23 ENCOUNTER — Other Ambulatory Visit (HOSPITAL_BASED_OUTPATIENT_CLINIC_OR_DEPARTMENT_OTHER): Payer: Self-pay

## 2023-02-23 MED ORDER — FLULAVAL 0.5 ML IM SUSY
0.5000 mL | PREFILLED_SYRINGE | Freq: Once | INTRAMUSCULAR | 0 refills | Status: AC
  Filled 2023-02-23: qty 0.5, 1d supply, fill #0

## 2023-03-27 ENCOUNTER — Other Ambulatory Visit: Payer: Self-pay | Admitting: Family Medicine

## 2023-03-27 DIAGNOSIS — F32A Depression, unspecified: Secondary | ICD-10-CM

## 2023-03-27 DIAGNOSIS — R41840 Attention and concentration deficit: Secondary | ICD-10-CM

## 2023-04-06 ENCOUNTER — Telehealth: Payer: Self-pay | Admitting: Family Medicine

## 2023-04-06 NOTE — Telephone Encounter (Signed)
Prescription Request  04/06/2023  LOV: 12/06/2022  What is the name of the medication or equipment? buPROPion buPROPion (WELLBUTRIN XL) 150 MG 24 hr tablet  Have you contacted your pharmacy to request a refill? Yes   Which pharmacy would you like this sent to?   [facility] [address] [place], [place] - [address] AT [facility] [address] [place]. [place] [address] Phone: [phone] Fax: [phone]     Patient notified that their request is being sent to the clinical staff for review and that they should receive a response within 2 business days.   Please advise at Mobile [phone] (mobile)

## 2023-04-20 ENCOUNTER — Ambulatory Visit: Payer: 59 | Admitting: Family Medicine

## 2023-04-20 VITALS — BP 120/74 | HR 76 | Temp 98.4°F | Ht 71.0 in | Wt 237.2 lb

## 2023-04-20 DIAGNOSIS — R41840 Attention and concentration deficit: Secondary | ICD-10-CM

## 2023-04-20 DIAGNOSIS — M5412 Radiculopathy, cervical region: Secondary | ICD-10-CM | POA: Diagnosis not present

## 2023-04-20 DIAGNOSIS — F32A Depression, unspecified: Secondary | ICD-10-CM | POA: Diagnosis not present

## 2023-04-20 DIAGNOSIS — I10 Essential (primary) hypertension: Secondary | ICD-10-CM

## 2023-04-20 MED ORDER — PREDNISONE 10 MG (21) PO TBPK: ORAL_TABLET | ORAL | 0 refills | Status: DC

## 2023-04-20 MED ORDER — METHOCARBAMOL 500 MG PO TABS: 500.0000 mg | ORAL_TABLET | Freq: Three times a day (TID) | ORAL | 0 refills | Status: DC | PRN

## 2023-04-20 MED ORDER — OLMESARTAN MEDOXOMIL 20 MG PO TABS: 20.0000 mg | ORAL_TABLET | Freq: Every day | ORAL | 0 refills | Status: DC

## 2023-04-20 MED ORDER — BUPROPION HCL ER (XL) 150 MG PO TB24: 150.0000 mg | ORAL_TABLET | Freq: Every day | ORAL | 0 refills | Status: DC

## 2023-04-20 NOTE — Progress Notes (Signed)
Established Patient Office Visit   Subjective:  Patient ID: Randall Collins, male    DOB: 1966-03-19  Age: 57 y.o. MRN: [id number]  Chief Complaint  Patient presents with   Neck Pain    C/o having neck/shoulder pain that radiates into arms/hands x 3 weeks.  Has been using heat/ice and taking Ibuprofen     Neck Pain  Associated symptoms include tingling. Pertinent negatives include no weakness.   Encounter Diagnoses  Name Primary?   Cervical radiculopathy at C8 Yes   Attention deficit    Depression, unspecified depression type    Essential hypertension    For follow-up of above.  3-week history left posterior neck pain that sometimes radiates down into the left hand.  There is mild burning in the fingers.  No injury.Marland Kitchen  Has been stiff.  Taking blood pressure medicine olmesartan 2-3 times weekly.  Continues with Wellbutrin for attention deficit.   Review of Systems  Constitutional: Negative.   HENT: Negative.    Eyes:  Negative for blurred vision, discharge and redness.  Respiratory: Negative.    Cardiovascular: Negative.   Gastrointestinal:  Negative for abdominal pain.  Genitourinary: Negative.   Musculoskeletal:  Positive for neck pain. Negative for myalgias.  Skin:  Negative for rash.  Neurological:  Positive for tingling. Negative for loss of consciousness and weakness.  Endo/Heme/Allergies:  Negative for polydipsia.     Current Outpatient Medications:    methocarbamol (ROBAXIN) 500 MG tablet, Take 1 tablet (500 mg total) by mouth every 8 (eight) hours as needed for muscle spasms., Disp: 40 tablet, Rfl: 0   mupirocin ointment (BACTROBAN) 2 %, Apply 1 Application topically 2 (two) times daily. As needed for outbreak of folliculitis., Disp: 22 g, Rfl: 0   NEEDLE, DISP, 18 G (BD DISP NEEDLES) 18G X 1-1/2" MISC, 1 Box by Does not apply route daily., Disp: 3 each, Rfl: 0   NEEDLE, DISP, 22 G (BD DISP NEEDLES) 22G X 1-1/2" MISC, 1 Box by Does not apply route as directed.,  Disp: 3 each, Rfl: 0   predniSONE (STERAPRED UNI-PAK 21 TAB) 10 MG (21) TBPK tablet, Take 6 today, 5 tomorrow, 4 the next day and then 3, 2, 1 and stop, Disp: 21 tablet, Rfl: 0   tadalafil (CIALIS) 20 MG tablet, May take 1 tablet every 3 days as needed for erectile dysfunction., Disp: 30 tablet, Rfl: 2   buPROPion (WELLBUTRIN XL) 150 MG 24 hr tablet, Take 1 tablet by mouth daily, Disp: 90 tablet, Rfl: 0   olmesartan (BENICAR) 20 MG tablet, Take 1 tablet (20 mg total) by mouth daily., Disp: 90 tablet, Rfl: 0   testosterone cypionate (DEPOTESTOSTERONE CYPIONATE) 200 MG/ML injection, Inject 1 mL (200 mg total) into the muscle every 14 (fourteen) days. (Patient not taking: Reported on 04/20/2023), Disp: 10 mL, Rfl: 1   Objective:     BP 120/74   Pulse 76   Temp 98.4 F (36.9 C) (Temporal)   Ht 5\' 11"  (1.803 m)   Wt 237 lb 3.2 oz (107.6 kg)   SpO2 95%   BMI 33.08 kg/m  BP Readings from Last 3 Encounters:  04/20/23 120/74  12/06/22 122/74  10/13/22 126/82   Wt Readings from Last 3 Encounters:  04/20/23 237 lb 3.2 oz (107.6 kg)  12/06/22 247 lb 12.8 oz (112.4 kg)  10/13/22 246 lb 6.4 oz (111.8 kg)      Physical Exam Constitutional:      General: He is not in acute distress.  Appearance: Normal appearance. He is not ill-appearing, toxic-appearing or diaphoretic.  HENT:     Head: Normocephalic and atraumatic.     Right Ear: External ear normal.     Left Ear: External ear normal.  Eyes:     General: No scleral icterus.       Right eye: No discharge.        Left eye: No discharge.     Extraocular Movements: Extraocular movements intact.     Conjunctiva/sclera: Conjunctivae normal.  Cardiovascular:     Rate and Rhythm: Normal rate and regular rhythm.  Pulmonary:     Effort: Pulmonary effort is normal. No respiratory distress.     Breath sounds: Normal breath sounds.  Musculoskeletal:     Cervical back: No rigidity, spasms, tenderness or bony tenderness. Decreased range of  motion.     Comments: Positive Spurling's to the left  Lymphadenopathy:     Cervical: No cervical adenopathy.  Skin:    General: Skin is warm and dry.  Neurological:     Mental Status: He is alert and oriented to person, place, and time.     Deep Tendon Reflexes:     Reflex Scores:      Tricep reflexes are 1+ on the right side and 1+ on the left side.      Bicep reflexes are 1+ on the right side and 1+ on the left side.      Brachioradialis reflexes are 1+ on the right side and 1+ on the left side. Psychiatric:        Mood and Affect: Mood normal.        Behavior: Behavior normal.      No results found for any visits on 04/20/23.    The ASCVD Risk score (Arnett DK, et al., 2019) failed to calculate for the following reasons:   The valid total cholesterol range is 130 to 320 mg/dL    Assessment & Plan:   Cervical radiculopathy at C8 -     predniSONE; Take 6 today, 5 tomorrow, 4 the next day and then 3, 2, 1 and stop  Dispense: 21 tablet; Refill: 0 -     Ambulatory referral to Sports Medicine -     Methocarbamol; Take 1 tablet (500 mg total) by mouth every 8 (eight) hours as needed for muscle spasms.  Dispense: 40 tablet; Refill: 0  Attention deficit -     buPROPion HCl ER (XL); Take 1 tablet by mouth daily  Dispense: 90 tablet; Refill: 0  Depression, unspecified depression type -     buPROPion HCl ER (XL); Take 1 tablet by mouth daily  Dispense: 90 tablet; Refill: 0  Essential hypertension -     Olmesartan Medoxomil; Take 1 tablet (20 mg total) by mouth daily.  Dispense: 90 tablet; Refill: 0    Return Overdue for physical., for annual physical.  6-day Dosepak with Robaxin 500 mg as needed.  Exercises given.  Sports medicine referral.  Overdue for physical.  Mliss Sax, MD

## 2023-04-27 ENCOUNTER — Ambulatory Visit: Payer: 59 | Admitting: Family Medicine

## 2023-05-08 ENCOUNTER — Ambulatory Visit (INDEPENDENT_AMBULATORY_CARE_PROVIDER_SITE_OTHER): Payer: 59 | Admitting: Family Medicine

## 2023-05-08 ENCOUNTER — Telehealth: Payer: Self-pay | Admitting: Family Medicine

## 2023-05-08 ENCOUNTER — Encounter: Payer: Self-pay | Admitting: Family Medicine

## 2023-05-08 VITALS — BP 118/74 | HR 67 | Temp 98.0°F | Ht 71.0 in | Wt 234.4 lb

## 2023-05-08 DIAGNOSIS — M5412 Radiculopathy, cervical region: Secondary | ICD-10-CM

## 2023-05-08 DIAGNOSIS — Z0000 Encounter for general adult medical examination without abnormal findings: Secondary | ICD-10-CM | POA: Diagnosis not present

## 2023-05-08 DIAGNOSIS — Z125 Encounter for screening for malignant neoplasm of prostate: Secondary | ICD-10-CM

## 2023-05-08 DIAGNOSIS — Z131 Encounter for screening for diabetes mellitus: Secondary | ICD-10-CM | POA: Diagnosis not present

## 2023-05-08 DIAGNOSIS — N5201 Erectile dysfunction due to arterial insufficiency: Secondary | ICD-10-CM | POA: Insufficient documentation

## 2023-05-08 DIAGNOSIS — Z1211 Encounter for screening for malignant neoplasm of colon: Secondary | ICD-10-CM

## 2023-05-08 DIAGNOSIS — I10 Essential (primary) hypertension: Secondary | ICD-10-CM

## 2023-05-08 DIAGNOSIS — R0683 Snoring: Secondary | ICD-10-CM | POA: Insufficient documentation

## 2023-05-08 DIAGNOSIS — E291 Testicular hypofunction: Secondary | ICD-10-CM | POA: Diagnosis not present

## 2023-05-08 DIAGNOSIS — Z13220 Encounter for screening for lipoid disorders: Secondary | ICD-10-CM

## 2023-05-08 MED ORDER — TADALAFIL 20 MG PO TABS: ORAL_TABLET | ORAL | 2 refills | Status: DC

## 2023-05-08 NOTE — Progress Notes (Addendum)
 Established Patient Office Visit   Subjective:  Patient ID: Randall Collins, male    DOB: [birth year]  Age: 58 y.o. MRN: [id number]  Chief Complaint  Patient presents with   Annual Exam    Pt is fasting.     HPI Encounter Diagnoses  Name Primary?   Healthcare maintenance Yes   Screening for prostate cancer    Screening for cholesterol level    Screening for diabetes mellitus    Screening for colon cancer    Androgen deficiency    Erectile dysfunction due to arterial insufficiency    Snores    Essential hypertension    Cervical radiculopathy at C8    For physical today in follow-up of above.  He lives at home with his wife.  He does exercise by walking and going to the gym.  He has regular dental care.  History of loud snoring.  Sleep study 10 years ago was negative.  His wife is still concerned.  No history of colonoscopy but did have a Cologuard a few years ago.  Requesting refill for tadalafil  for ED that has worked for him in the past.  History of androgen deficiency.  He took testosterone  for a month about a year ago and then stopped.  He does not smoke or use illicit drugs.  He rarely drinks alcohol.   Review of Systems  Constitutional: Negative.   HENT: Negative.    Eyes:  Negative for blurred vision, discharge and redness.  Respiratory: Negative.    Cardiovascular: Negative.   Gastrointestinal:  Negative for abdominal pain.  Genitourinary: Negative.   Musculoskeletal: Negative.  Negative for myalgias.  Skin:  Negative for rash.  Neurological:  Negative for tingling, loss of consciousness and weakness.  Endo/Heme/Allergies:  Negative for polydipsia.      05/08/2023    3:44 PM 10/13/2022    1:43 PM 03/28/2022    3:21 PM  Depression screen PHQ 2/9  Decreased Interest 0 0 0  Down, Depressed, Hopeless 0 0 0  PHQ - 2 Score 0 0 0  Altered sleeping 1    Tired, decreased energy 0    Change in appetite 1    Feeling bad or failure about yourself  0    Trouble  concentrating 0    Moving slowly or fidgety/restless 0    Suicidal thoughts 0    PHQ-9 Score 2    Difficult doing work/chores Not difficult at all  Not difficult at all       Current Outpatient Medications:    buPROPion  (WELLBUTRIN  XL) 150 MG 24 hr tablet, Take 1 tablet by mouth daily, Disp: 90 tablet, Rfl: 0   methocarbamol  (ROBAXIN ) 500 MG tablet, Take 1 tablet (500 mg total) by mouth every 8 (eight) hours as needed for muscle spasms., Disp: 40 tablet, Rfl: 0   mupirocin  ointment (BACTROBAN ) 2 %, Apply 1 Application topically 2 (two) times daily. As needed for outbreak of folliculitis., Disp: 22 g, Rfl: 0   NEEDLE, DISP, 18 G (BD DISP NEEDLES) 18G X 1-1/2 MISC, 1 Box by Does not apply route daily., Disp: 3 each, Rfl: 0   NEEDLE, DISP, 22 G (BD DISP NEEDLES) 22G X 1-1/2 MISC, 1 Box by Does not apply route as directed., Disp: 3 each, Rfl: 0   olmesartan  (BENICAR ) 20 MG tablet, Take 1 tablet (20 mg total) by mouth daily., Disp: 90 tablet, Rfl: 0   predniSONE  (STERAPRED UNI-PAK 21 TAB) 10 MG (21) TBPK tablet, Take 6 today,  5 tomorrow, 4 the next day and then 3, 2, 1 and stop (Patient not taking: Reported on 05/08/2023), Disp: 21 tablet, Rfl: 0   tadalafil  (CIALIS ) 20 MG tablet, May take 1 tablet every 3 days as needed for erectile dysfunction., Disp: 30 tablet, Rfl: 2   testosterone  cypionate (DEPOTESTOSTERONE CYPIONATE) 200 MG/ML injection, Inject 1 mL (200 mg total) into the muscle every 14 (fourteen) days. (Patient not taking: Reported on 05/08/2023), Disp: 10 mL, Rfl: 1   Objective:     BP 118/74   Pulse 67   Temp 98 F (36.7 C)   Ht 5' 11 (1.803 m)   Wt 234 lb 6.4 oz (106.3 kg)   SpO2 96%   BMI 32.69 kg/m    Physical Exam Constitutional:      General: He is not in acute distress.    Appearance: Normal appearance. He is not ill-appearing, toxic-appearing or diaphoretic.  HENT:     Head: Normocephalic and atraumatic.     Right Ear: Tympanic membrane, ear canal and external  ear normal.     Left Ear: Tympanic membrane, ear canal and external ear normal.     Mouth/Throat:     Mouth: Mucous membranes are moist.     Pharynx: Oropharynx is clear. No oropharyngeal exudate or posterior oropharyngeal erythema.   Eyes:     General: No scleral icterus.       Right eye: No discharge.        Left eye: No discharge.     Extraocular Movements: Extraocular movements intact.     Conjunctiva/sclera: Conjunctivae normal.     Pupils: Pupils are equal, round, and reactive to light.  Cardiovascular:     Rate and Rhythm: Normal rate and regular rhythm.  Pulmonary:     Effort: Pulmonary effort is normal. No respiratory distress.     Breath sounds: Normal breath sounds.  Abdominal:     General: Bowel sounds are normal.     Tenderness: There is no abdominal tenderness. There is no guarding.     Hernia: There is no hernia in the left inguinal area or right inguinal area.  Genitourinary:    Penis: Circumcised. No hypospadias, erythema, tenderness, discharge, swelling or lesions.      Testes:        Right: Mass, tenderness or swelling not present. Right testis is descended.        Left: Mass, tenderness or swelling not present. Left testis is descended.     Epididymis:     Right: Not inflamed or enlarged.     Left: Not inflamed or enlarged.  Musculoskeletal:     Cervical back: No rigidity or tenderness.  Lymphadenopathy:     Lower Body: No right inguinal adenopathy. No left inguinal adenopathy.  Skin:    General: Skin is warm and dry.  Neurological:     Mental Status: He is alert and oriented to person, place, and time.  Psychiatric:        Mood and Affect: Mood normal.        Behavior: Behavior normal.      No results found for any visits on 05/08/23.    The ASCVD Risk score (Arnett DK, et al., 2019) failed to calculate for the following reasons:   The valid total cholesterol range is 130 to 320 mg/dL    Assessment & Plan:   Healthcare maintenance -     CBC  with Differential/Platelet -     Urinalysis, Routine w reflex microscopic  Screening for prostate cancer -     PSA  Screening for cholesterol level -     Comprehensive metabolic panel -     Lipid panel  Screening for diabetes mellitus -     Comprehensive metabolic panel -     Hemoglobin A1c  Screening for colon cancer -     Ambulatory referral to Gastroenterology  Androgen deficiency -     Testosterone  Total,Free,Bio, Males  Erectile dysfunction due to arterial insufficiency -     Tadalafil ; May take 1 tablet every 3 days as needed for erectile dysfunction.  Dispense: 30 tablet; Refill: 2  Snores -     Ambulatory referral to Pulmonology  Essential hypertension  Cervical radiculopathy at C8    Return in about 6 months (around 11/05/2023), or if symptoms worsen or fail to improve.  Information was given on health maintenance and disease prevention.  Prescription for tadalafil  for ED.  Rechecking testosterone  for history of androgen deficiency.  Has never had a colonoscopy.  Follow-up of loud snoring with pulmonology for repeat sleep study.  Continue olmesartan  for hypertension.  Had referred to sports medicine for cervical radiculopathy but referral made was out of network.  He will let me know who I should use.  Elsie Sim Lent, MD

## 2023-05-09 ENCOUNTER — Encounter: Payer: Self-pay | Admitting: Family Medicine

## 2023-05-09 ENCOUNTER — Other Ambulatory Visit: Payer: Self-pay | Admitting: Family Medicine

## 2023-05-09 DIAGNOSIS — R41840 Attention and concentration deficit: Secondary | ICD-10-CM

## 2023-05-09 DIAGNOSIS — F32A Depression, unspecified: Secondary | ICD-10-CM

## 2023-05-09 LAB — URINALYSIS, ROUTINE W REFLEX MICROSCOPIC
Bilirubin Urine: NEGATIVE
Hgb urine dipstick: NEGATIVE
Ketones, ur: NEGATIVE
Leukocytes,Ua: NEGATIVE
Nitrite: NEGATIVE
RBC / HPF: NONE SEEN (ref 0–?)
Specific Gravity, Urine: >=1.03 — AB (ref 1.000–1.030)
Total Protein, Urine: NEGATIVE
Urine Glucose: NEGATIVE
Urobilinogen, UA: 0.2 (ref 0.0–1.0)
pH: 5.5 (ref 5.0–8.0)

## 2023-05-09 LAB — COMPREHENSIVE METABOLIC PANEL
ALT: 23 U/L (ref 0–53)
AST: 16 U/L (ref 0–37)
Albumin: 4.7 g/dL (ref 3.5–5.2)
Alkaline Phosphatase: 73 U/L (ref 39–117)
BUN: 15 mg/dL (ref 6–23)
CO2: 27 meq/L (ref 19–32)
Calcium: 9.5 mg/dL (ref 8.4–10.5)
Chloride: 106 meq/L (ref 96–112)
Creatinine, Ser: 0.93 mg/dL (ref 0.40–1.50)
GFR: 90.97 mL/min (ref >60.00)
Glucose, Bld: 93 mg/dL (ref 70–99)
Potassium: 4.2 meq/L (ref 3.5–5.1)
Sodium: 143 meq/L (ref 135–145)
Total Bilirubin: 0.5 mg/dL (ref 0.2–1.2)
Total Protein: 6.8 g/dL (ref 6.0–8.3)

## 2023-05-09 LAB — TESTOSTERONE TOTAL,FREE,BIO, MALES
Albumin: 4.5 g/dL (ref 3.6–5.1)
Sex Hormone Binding: 32 nmol/L (ref 22–77)
Testosterone, Bioavailable: 113.2 ng/dL (ref 110.0–575.0)
Testosterone, Free: 55 pg/mL (ref 46.0–224.0)
Testosterone: 409 ng/dL (ref 250–827)

## 2023-05-09 LAB — CBC WITH DIFFERENTIAL/PLATELET
Basophils Absolute: 0.1 10*3/uL (ref 0.0–0.1)
Basophils Relative: 0.9 % (ref 0.0–3.0)
Eosinophils Absolute: 0.1 10*3/uL (ref 0.0–0.7)
Eosinophils Relative: 1.3 % (ref 0.0–5.0)
HCT: 45.3 % (ref 39.0–52.0)
Hemoglobin: 15.3 g/dL (ref 13.0–17.0)
Lymphocytes Relative: 26.2 % (ref 12.0–46.0)
Lymphs Abs: 1.6 10*3/uL (ref 0.7–4.0)
MCHC: 33.9 g/dL (ref 30.0–36.0)
MCV: 89.1 fL (ref 78.0–100.0)
Monocytes Absolute: 0.3 10*3/uL (ref 0.1–1.0)
Monocytes Relative: 4.5 % (ref 3.0–12.0)
Neutro Abs: 4.2 10*3/uL (ref 1.4–7.7)
Neutrophils Relative %: 67.1 % (ref 43.0–77.0)
Platelets: 244 10*3/uL (ref 150.0–400.0)
RBC: 5.09 Mil/uL (ref 4.22–5.81)
RDW: 13.4 % (ref 11.5–15.5)
WBC: 6.2 10*3/uL (ref 4.0–10.5)

## 2023-05-09 LAB — LIPID PANEL
Cholesterol: 186 mg/dL (ref 0–200)
HDL: 34.8 mg/dL — ABNORMAL LOW (ref >39.00)
LDL Cholesterol: 112 mg/dL — ABNORMAL HIGH (ref 0–99)
NonHDL: 151.53
Total CHOL/HDL Ratio: 5
Triglycerides: 196 mg/dL — ABNORMAL HIGH (ref 0.0–149.0)
VLDL: 39.2 mg/dL (ref 0.0–40.0)

## 2023-05-09 LAB — PSA: PSA: 2.61 ng/mL (ref 0.10–4.00)

## 2023-05-09 LAB — HEMOGLOBIN A1C: Hgb A1c MFr Bld: 5.5 % (ref 4.6–6.5)

## 2023-06-13 ENCOUNTER — Other Ambulatory Visit: Payer: Self-pay | Admitting: Family Medicine

## 2023-06-13 DIAGNOSIS — R41840 Attention and concentration deficit: Secondary | ICD-10-CM

## 2023-06-13 DIAGNOSIS — F32A Depression, unspecified: Secondary | ICD-10-CM

## 2023-06-13 DIAGNOSIS — I10 Essential (primary) hypertension: Secondary | ICD-10-CM

## 2023-10-04 ENCOUNTER — Other Ambulatory Visit: Payer: Self-pay | Admitting: Family Medicine

## 2023-10-04 DIAGNOSIS — L739 Follicular disorder, unspecified: Secondary | ICD-10-CM

## 2023-10-04 NOTE — Telephone Encounter (Signed)
 Copied from CRM [id number]. Topic: Clinical - Medication Refill >> Oct 04, 2023  1:54 PM Aisha D wrote: Medication: mupirocin  ointment (BACTROBAN ) 2 %  Has the patient contacted their pharmacy? No (Agent: If no, request that the patient contact the pharmacy for the refill. If patient does not wish to contact the pharmacy document the reason why and proceed with request.) (Agent: If yes, when and what did the pharmacy advise?)  This is the patient's preferred pharmacy:   [id number] [place], [place] - [address] [place]. AT [facility] [address] [place]. [place] [address] Phone: [phone] Fax: [phone]   Is this the correct pharmacy for this prescription? Yes If no, delete pharmacy and type the correct one.   Has the prescription been filled recently? No  Is the patient out of the medication? Yes  Has the patient been seen for an appointment in the last year OR does the patient have an upcoming appointment? Yes  Can we respond through MyChart? No  Agent: Please be advised that Rx refills may take up to 3 business days. We ask that you follow-up with your pharmacy.

## 2023-10-05 MED ORDER — MUPIROCIN 2 % EX OINT: 1.0000 | TOPICAL_OINTMENT | Freq: Two times a day (BID) | CUTANEOUS | 0 refills | Status: AC

## 2024-04-03 ENCOUNTER — Encounter: Payer: Self-pay | Admitting: Pulmonary Disease

## 2024-04-03 ENCOUNTER — Ambulatory Visit: Admitting: Pulmonary Disease

## 2024-04-03 VITALS — BP 121/72 | HR 57 | Temp 98.1°F | Ht 71.0 in | Wt 233.4 lb

## 2024-04-03 DIAGNOSIS — G478 Other sleep disorders: Secondary | ICD-10-CM

## 2024-04-03 DIAGNOSIS — I10 Essential (primary) hypertension: Secondary | ICD-10-CM

## 2024-04-03 DIAGNOSIS — R1319 Other dysphagia: Secondary | ICD-10-CM

## 2024-04-03 DIAGNOSIS — N529 Male erectile dysfunction, unspecified: Secondary | ICD-10-CM

## 2024-04-03 DIAGNOSIS — G4700 Insomnia, unspecified: Secondary | ICD-10-CM

## 2024-04-03 DIAGNOSIS — R0683 Snoring: Secondary | ICD-10-CM | POA: Diagnosis not present

## 2024-04-03 NOTE — Patient Instructions (Signed)
 For snoring - Continue weight loss efforts -With a negative sleep study in the past - Best approach is to schedule you for an in-lab study to definitively rule out significant sleep disordered breathing  Sleep-wake cycle disorder - This is related to your night shift work - Metallurgist as able  Sialorrhea Choking episode Dysphagia -Referral to GI -I think is important to make sure that there is no narrowing or obstruction in your airway  For primary snoring - Mandibular advancement devices can help -Ensure you are sleeping on your side -Elevating the head of the bed by about 30 degrees may help  Continue to exercise regularly and focus on weight loss  Tentative follow-up in about 3 months  Call us  with significant concerns

## 2024-04-03 NOTE — Progress Notes (Signed)
 Randall Collins    [id number]    03-30-1966  Primary Care Physician:Kremer, Elsie Sayre, MD  Referring Physician: Berneta Elsie Sayre, MD [address] [place],  [place] [address]  Chief complaint:   Patient being seen for concern for sleep disordered breathing  Discussed the use of AI scribe software for clinical note transcription with the patient, who gave verbal consent to proceed.  History of Present Illness Randall Collins Randall Collins is a 58 year old male who presents with excessive phlegm production and possible dysphagia.  For the past year, he has experienced excessive phlegm production, which occurs even when he is not talking. There is a slight increase in the morning, but no significant nasal stuffiness or congestion. He does not experience coughing or shortness of breath and has not identified any specific triggers for the phlegm production.  Over the last four to five months, he has experienced episodes of dysphagia, particularly with breads, where he feels he might choke. He has not had a full choking episode but has come close. He has not consulted a gastroenterologist regarding these symptoms.  He has a history of snoring, described by his wife as 'pretty bad' and loud. He does not wake up choking or gasping for air, nor does he experience morning headaches or unusual night sweats. No memory issues beyond his usual baseline.  He works night shifts from 7 PM to 7 AM on weekends, which disrupts his sleep schedule. On his days off, he struggles with maintaining a consistent sleep routine, often waking up in the middle of the night and having difficulty returning to sleep. He has been working night shifts for the past five years, which has affected his sleep pattern.  His mother has a history of sleep apnea and uses a CPAP machine. He had a sleep study done seven years ago, which was negative for sleep apnea. He has recently lost about ten pounds. He  exercises regularly and has been working out with his son, which has improved his overall well-being.  His study over 10 years ago was negative for significant sleep disordered breathing  History of coronary artery disease - Stable Hypertension, erectile dysfunction  Never smoker  Outpatient Encounter Medications as of 04/03/2024  Medication Sig   BENICAR  20 MG tablet TAKE 1 TABLET BY MOUTH ONCE A DAY   buPROPion  (WELLBUTRIN  XL) 150 MG 24 hr tablet TAKE 1 TABLET BY MOUTH ONCE A DAY   mupirocin  ointment (BACTROBAN ) 2 % Apply 1 Application topically 2 (two) times daily. As needed for outbreak of folliculitis.   tadalafil  (CIALIS ) 20 MG tablet May take 1 tablet every 3 days as needed for erectile dysfunction.   [DISCONTINUED] NEEDLE, DISP, 18 G (BD DISP NEEDLES) 18G X 1-1/2 MISC 1 Box by Does not apply route daily.   [DISCONTINUED] NEEDLE, DISP, 22 G (BD DISP NEEDLES) 22G X 1-1/2 MISC 1 Box by Does not apply route as directed.   [DISCONTINUED] methocarbamol  (ROBAXIN ) 500 MG tablet Take 1 tablet (500 mg total) by mouth every 8 (eight) hours as needed for muscle spasms.   [DISCONTINUED] predniSONE  (STERAPRED UNI-PAK 21 TAB) 10 MG (21) TBPK tablet Take 6 today, 5 tomorrow, 4 the next day and then 3, 2, 1 and stop (Patient not taking: Reported on 05/08/2023)   [DISCONTINUED] testosterone  cypionate (DEPOTESTOSTERONE CYPIONATE) 200 MG/ML injection Inject 1 mL (200 mg total) into the muscle every 14 (fourteen) days. (Patient not taking: Reported on 05/08/2023)  No facility-administered encounter medications on file as of 04/03/2024.    Allergies as of 04/03/2024 - Review Complete 04/03/2024  Allergen Reaction Noted   Clindamycin  Other (See Comments) 03/25/2022   Keflex [cephalexin] Rash 03/29/2018    Past Medical History:  Diagnosis Date   ADD (attention deficit disorder)    ED (erectile dysfunction)    Mixed hyperlipidemia     History reviewed. No pertinent surgical history.  Family  History  Problem Relation Age of Onset   Diabetes Father    Hypertension Father    Heart attack Father    Heart attack Paternal Grandfather    Heart attack Brother     Social History   Socioeconomic History   Marital status: Married    Spouse name: Not on file   Number of children: Not on file   Years of education: Not on file   Highest education level: Associate degree: academic program  Occupational History   Not on file  Tobacco Use   Smoking status: Never    Passive exposure: Past   Smokeless tobacco: Never  Vaping Use   Vaping status: Never Used  Substance and Sexual Activity   Alcohol use: Yes    Comment: socially   Drug use: Never   Sexual activity: Yes    Partners: Female  Other Topics Concern   Not on file  Social History Narrative   Not on file   Social Drivers of Health   Financial Resource Strain: Low Risk  (04/20/2023)   Overall Financial Resource Strain (CARDIA)    Difficulty of Paying Living Expenses: Not very hard  Food Insecurity: No Food Insecurity (04/20/2023)   Hunger Vital Sign    Worried About Running Out of Food in the Last Year: Never true    Ran Out of Food in the Last Year: Never true  Transportation Needs: No Transportation Needs (04/20/2023)   PRAPARE - Administrator, Civil Service (Medical): No    Lack of Transportation (Non-Medical): No  Physical Activity: Sufficiently Active (04/20/2023)   Exercise Vital Sign    Days of Exercise per Week: 4 days    Minutes of Exercise per Session: 40 min  Stress: No Stress Concern Present (04/20/2023)   [employer] of Occupational Health - Occupational Stress Questionnaire    Feeling of Stress : Only a little  Social Connections: Unknown (04/20/2023)   Social Connection and Isolation Panel    Frequency of Communication with Friends and Family: Once a week    Frequency of Social Gatherings with Friends and Family: Patient declined    Attends Religious Services: Never     Database Administrator or Organizations: No    Attends Engineer, Structural: Not on file    Marital Status: Married  Catering Manager Violence: Not on file    Review of Systems  Psychiatric/Behavioral:  Positive for sleep disturbance.     Vitals:   04/03/24 1305  BP: 121/72  Pulse: (!) 57  Temp: 98.1 F (36.7 C)  SpO2: 96%     Physical Exam Constitutional:      Appearance: He is obese.  HENT:     Head: Normocephalic.     Mouth/Throat:     Mouth: Mucous membranes are moist.     Comments: Crowded oropharynx, Mallampati 3 Eyes:     General: No scleral icterus.    Pupils: Pupils are equal, round, and reactive to light.  Cardiovascular:     Rate and Rhythm: Normal rate  and regular rhythm.     Heart sounds: No murmur heard.    No friction rub.  Pulmonary:     Effort: No respiratory distress.     Breath sounds: No stridor. No wheezing or rhonchi.  Musculoskeletal:     Cervical back: No rigidity or tenderness.  Neurological:     Mental Status: He is alert.  Psychiatric:        Mood and Affect: Mood normal.    Data Reviewed: CT scan 04/29/2019 with multifocal infiltrate-reviewed by myself  Previous sleep study not available to be reviewed  Assessment and Plan Assessment & Plan Snoring Chronic snoring with loud intensity, potentially related to obstructive sleep apnea. Nocturnal choking or gasping not reported. Family history of sleep apnea in mother. Previous negative sleep study in a sleep lab. Current symptoms do not strongly suggest sleep apnea, but further evaluation is warranted due to family history and snoring severity. - Scheduled in-lab sleep study to evaluate for obstructive sleep apnea. - Discussed weight loss as a potential intervention to reduce snoring. - Advised on non-pharmacological interventions such as elevating the head of the bed and sleeping on the side. - Discussed mandibular advancement devices as a potential treatment for  snoring.  Suspected obstructive sleep apnea Due to snoring and family history. Previous negative sleep study. No significant daytime sleepiness or other symptoms suggestive of sleep apnea. Discussed the importance of confirming diagnosis with an in-lab sleep study. Explained that a positive study would guide treatment decisions, while a negative study would rule out sleep apnea. Discussed potential benefits of treating sleep apnea, including improved sleep quality, reduced risk of hypertension, and prevention of cognitive decline. - Scheduled in-lab sleep study to confirm or rule out obstructive sleep apnea. - Discussed potential treatment options if sleep apnea is confirmed, including CPAP therapy.  Shift work sleep disorder Due to irregular sleep schedule from working night shifts. Difficulty maintaining a consistent sleep routine, leading to fragmented sleep. Discussed the importance of establishing a consistent sleep routine to improve sleep quality. Explained that the body's circadian rhythm is disrupted by irregular work hours, leading to difficulty in maintaining sleep. - Advised on establishing a consistent sleep routine as much as possible. - Encouraged regular exercise and weight loss efforts to improve overall sleep quality.  Dysphagia Intermittent dysphagia, particularly with breads, over the past 4-5 months. No choking episodes reported, but close calls noted. Potential causes include esophageal narrowing or other structural issues. Discussed the importance of evaluating for esophageal narrowing or other structural abnormalities, especially given the risk of cancer with age. - Referred to a gastroenterologist for swallowing evaluation and possible endoscopy. - Discussed potential causes of dysphagia, including esophageal narrowing or structural abnormalities.  Hypertension - Well-controlled  Erectile dysfunction - May be related to untreated sleep disordered breathing    Orders  Placed This Encounter  Procedures   Ambulatory referral to Gastroenterology    Referral Priority:   Routine    Referral Type:   Consultation    Referral Reason:   Specialty Services Required    Number of Visits Requested:   1   Polysomnography 4 or more parameters (NPSG)    Standing Status:   Future    Expiration Date:   04/03/2025    Where should this test be performed::   [facility]    I personally spent a total of 62 minutes in the care of the patient today including preparing to see the patient, getting/reviewing separately obtained history, performing  a medically appropriate exam/evaluation, counseling and educating, placing orders, and documenting clinical information in the EHR.   Jennet Epley MD [facility] 04/03/2024, 1:34 PM  CC: Berneta Elsie Sayre,*

## 2024-05-22 ENCOUNTER — Other Ambulatory Visit: Payer: Self-pay | Admitting: Family Medicine

## 2024-05-22 DIAGNOSIS — N5201 Erectile dysfunction due to arterial insufficiency: Secondary | ICD-10-CM

## 2024-05-23 ENCOUNTER — Encounter: Payer: Self-pay | Admitting: Gastroenterology

## 2024-05-29 ENCOUNTER — Other Ambulatory Visit: Payer: Self-pay

## 2024-05-29 ENCOUNTER — Other Ambulatory Visit (HOSPITAL_COMMUNITY): Payer: Self-pay

## 2024-05-29 ENCOUNTER — Other Ambulatory Visit: Payer: Self-pay | Admitting: Family Medicine

## 2024-05-29 DIAGNOSIS — N529 Male erectile dysfunction, unspecified: Secondary | ICD-10-CM

## 2024-05-29 MED ORDER — BUPROPION HCL ER (XL) 150 MG PO TB24
150.0000 mg | ORAL_TABLET | Freq: Every day | ORAL | 1 refills | Status: AC
  Filled 2024-05-29: qty 90, 90d supply, fill #0

## 2024-05-29 MED FILL — Tadalafil Tab 20 MG: ORAL | 90 days supply | Qty: 30 | Fill #0 | Status: AC

## 2024-05-29 NOTE — Telephone Encounter (Signed)
 Medication: Bupropion  (Wellbutrin  XL) 150 Mg 24 hr  Directions: Take 1 tablet by mouth once a day Last given: 06/13/23 Number refills: 0 Last o/v: 05/08/23 Follow up: none Patient has not been back to office did not return 11/05/2023

## 2024-05-29 NOTE — Telephone Encounter (Signed)
 Medication: Tadalafil  (Cialis ) 20 Mg 18 tablets Directions: 1 by mouth every 3 days as needed Last given: 05/22/24 Number refills: 0 Last o/v: 05/08/23 Follow up: none scheduled This medication is a duplicate and already has one refill in place sent in 05/22/24  A message was sent to notify patient in myChart that a refill had already been completed  on 05/22/24 and was available for pick up at [facility].

## 2024-05-30 ENCOUNTER — Other Ambulatory Visit: Payer: Self-pay

## 2024-06-13 ENCOUNTER — Ambulatory Visit (HOSPITAL_BASED_OUTPATIENT_CLINIC_OR_DEPARTMENT_OTHER): Admitting: Pulmonary Disease

## 2024-06-18 ENCOUNTER — Ambulatory Visit: Admitting: Gastroenterology

## 2024-07-09 ENCOUNTER — Ambulatory Visit: Admitting: Pulmonary Disease
